# Patient Record
Sex: Male | Born: 1935 | Race: White | Hispanic: No | Marital: Married | Smoking: Former smoker
Health system: Southern US, Community
[De-identification: ages and names within clinical notes are randomized; demographics above are authoritative.]

## PROBLEM LIST (undated history)

## (undated) DIAGNOSIS — N400 Enlarged prostate without lower urinary tract symptoms: Secondary | ICD-10-CM

## (undated) DIAGNOSIS — E785 Hyperlipidemia, unspecified: Secondary | ICD-10-CM

## (undated) DIAGNOSIS — K219 Gastro-esophageal reflux disease without esophagitis: Secondary | ICD-10-CM

## (undated) DIAGNOSIS — M810 Age-related osteoporosis without current pathological fracture: Secondary | ICD-10-CM

## (undated) HISTORY — DX: Benign prostatic hyperplasia without lower urinary tract symptoms: N40.0

## (undated) HISTORY — DX: Hyperlipidemia, unspecified: E78.5

## (undated) HISTORY — DX: Age-related osteoporosis without current pathological fracture: M81.0

## (undated) HISTORY — PX: CATARACT EXTRACTION, BILATERAL: SHX1313

---

## 1998-02-24 ENCOUNTER — Emergency Department (HOSPITAL_COMMUNITY): Admission: EM | Admit: 1998-02-24 | Discharge: 1998-02-24 | Payer: Self-pay | Admitting: Emergency Medicine

## 2001-01-11 ENCOUNTER — Ambulatory Visit (HOSPITAL_COMMUNITY): Admission: RE | Admit: 2001-01-11 | Discharge: 2001-01-11 | Payer: Self-pay | Admitting: Gastroenterology

## 2003-03-06 ENCOUNTER — Encounter: Payer: Self-pay | Admitting: Family Medicine

## 2003-03-06 ENCOUNTER — Encounter: Admission: RE | Admit: 2003-03-06 | Discharge: 2003-03-06 | Payer: Self-pay | Admitting: Family Medicine

## 2005-02-19 ENCOUNTER — Encounter: Admission: RE | Admit: 2005-02-19 | Discharge: 2005-02-19 | Payer: Self-pay | Admitting: Family Medicine

## 2007-10-25 ENCOUNTER — Encounter: Admission: RE | Admit: 2007-10-25 | Discharge: 2007-10-25 | Payer: Self-pay | Admitting: Family Medicine

## 2010-06-27 ENCOUNTER — Encounter: Admission: RE | Admit: 2010-06-27 | Discharge: 2010-06-27 | Payer: Self-pay | Admitting: Family Medicine

## 2011-03-27 NOTE — Procedures (Signed)
[facility]  Patient:    Steven [name], Steven [name]                         MRN: [id number] Proc. Date: 01/11/01 Adm. Date:  [id number] Attending:  Louie Bun CC:         Desma Maxim, M.D.   Procedure Report  PROCEDURE:  Colonoscopy.  INDICATION FOR PROCEDURE:  Screening colonoscopy in a 77 year old patient.  DESCRIPTION OF PROCEDURE:  The patient was placed in the left lateral decubitus position and placed on a pulse monitor with continuous low-flow oxygen delivered by nasal cannula.  He was sedated with 90 mg IV Demerol and 9 mg IV Versed.  The Olympus video colonoscope was inserted into the rectum and advanced to the cecum, confirmed by transillumination at McBurneys point and visualization of the ileocecal valve and appendiceal orifice.  The prep was good.  The cecum, ascending, transverse, descending, and sigmoid colon all appeared normal with no masses, polyps, diverticula, or other mucosal abnormalities.  The rectum likewise appeared normal, and retroflex view of the anus revealed small internal hemorrhoids.  The colonoscope was then withdrawn, and the patient returned to the recovery room in stable condition.  He tolerated the procedure well, and there were no immediate complications.  IMPRESSION:  Small internal hemorrhoids, otherwise normal screening colonoscopy. DD:  01/11/01 TD:  01/12/01 Job: [id number]

## 2012-06-03 DIAGNOSIS — IMO0002 Reserved for concepts with insufficient information to code with codable children: Secondary | ICD-10-CM | POA: Insufficient documentation

## 2014-12-26 ENCOUNTER — Ambulatory Visit (INDEPENDENT_AMBULATORY_CARE_PROVIDER_SITE_OTHER): Payer: Medicare Other | Admitting: Podiatry

## 2014-12-26 VITALS — BP 140/71 | HR 76 | Resp 16

## 2014-12-26 DIAGNOSIS — M779 Enthesopathy, unspecified: Secondary | ICD-10-CM

## 2014-12-26 DIAGNOSIS — M79673 Pain in unspecified foot: Secondary | ICD-10-CM

## 2014-12-26 DIAGNOSIS — B351 Tinea unguium: Secondary | ICD-10-CM

## 2014-12-26 DIAGNOSIS — L84 Corns and callosities: Secondary | ICD-10-CM

## 2014-12-26 MED ORDER — TRIAMCINOLONE ACETONIDE 10 MG/ML IJ SUSP
10.0000 mg | Freq: Once | INTRAMUSCULAR | Status: AC
  Administered 2014-12-26: 10 mg

## 2014-12-26 NOTE — Progress Notes (Signed)
   Subjective:    Patient ID: Steven [name], male    DOB: [birth year], 81 y.o.   MRN: [id number]  HPI Pt presents with painful left foot callus and painful long nails   Review of Systems  All other systems reviewed and are negative.      Objective:   Physical Exam        Assessment & Plan:

## 2014-12-27 NOTE — Progress Notes (Signed)
Subjective:     Patient ID: Steven [name], male   DOB: Apr 07, 1931, 81 y.o.   MRN: [id number]  HPI patient presents with quite a bit of discomfort around the head of the fifth metatarsal left with fluid buildup and lesion formation and nail disease 1-5 both feet that are thick yellow brittle and he cannot cut himself and states they become tender in shoe gear   Review of Systems     Objective:   Physical Exam Neurovascular status intact with muscle strength adequate and noted to have fluid buildup fifth MPJ left and keratotic tissue formation that's painful along with nail disease 1-5 both feet that are yellow brittle and painful when pressed    Assessment:     Mycotic nail infection with inflammatory capsulitis fifth MPJ left and lesion formation    Plan:     H&P and condition discussed and did a careful plantar capsule injection 3 mg dexamethasone Kenalog 5 mill grams Xylocaine and did deep debridement of lesions and debrided nailbeds 1-5 both feet with no iatrogenic bleeding noted

## 2015-02-14 ENCOUNTER — Ambulatory Visit (INDEPENDENT_AMBULATORY_CARE_PROVIDER_SITE_OTHER): Payer: Medicare Other | Admitting: Podiatry

## 2015-02-14 ENCOUNTER — Ambulatory Visit (INDEPENDENT_AMBULATORY_CARE_PROVIDER_SITE_OTHER): Payer: Medicare Other

## 2015-02-14 VITALS — BP 125/71 | HR 80 | Resp 15

## 2015-02-14 DIAGNOSIS — R0989 Other specified symptoms and signs involving the circulatory and respiratory systems: Secondary | ICD-10-CM

## 2015-02-14 DIAGNOSIS — I7025 Atherosclerosis of native arteries of other extremities with ulceration: Secondary | ICD-10-CM

## 2015-02-14 DIAGNOSIS — M79672 Pain in left foot: Secondary | ICD-10-CM | POA: Diagnosis not present

## 2015-02-14 DIAGNOSIS — Q667 Congenital pes cavus: Secondary | ICD-10-CM | POA: Diagnosis not present

## 2015-02-14 DIAGNOSIS — M216X9 Other acquired deformities of unspecified foot: Secondary | ICD-10-CM

## 2015-02-14 DIAGNOSIS — L84 Corns and callosities: Secondary | ICD-10-CM | POA: Diagnosis not present

## 2015-02-17 NOTE — Progress Notes (Signed)
Subjective:     Patient ID: Steven [name], male   DOB: Mar 02, 1931, 81 y.o.   MRN: [id number]  HPI patient presents with quite a bit of pain around his fifth metatarsal head left that did well with injection trimming but now has reoccurred and states that it's hard for him to walk on.   Review of Systems     Objective:   Physical Exam Neurovascular status remains diminished but intact with good digital perfusion noted but severe discomfort underneath the fifth metatarsal head left with no fat pad noted and minimal keratotic tissue formation    Assessment:     Plantar flexed fifth metatarsal with no fat pad that's present with small lesion formation that is only a part of the pathological process    Plan:     I reviewed the fifth metatarsal head issue and in a perfect world I would like to consider removal and I'm for sending for vascular study to confirm adequate healing circulatory status. Today I debrided the lesion and padded and we will send him for vascular studies and be seen back again in 2 weeks to review the studies and what we can do to help him long-term. He is wearing padding and an issue and we may try to increase that for him

## 2015-02-19 ENCOUNTER — Telehealth: Payer: Self-pay | Admitting: Podiatry

## 2015-02-19 NOTE — Telephone Encounter (Signed)
Patient called stating he wants to cancel both the MRI Dr. Paulla Dolly scheduled for him as well as his follow up appointment scheduled with Dr. Paulla Dolly on 04/25. States he does not want to have surgery right now. Will call back if he decides to reschedule his appointment with [place]. Patient is calling to cancel the MRI himself.

## 2015-02-21 NOTE — Telephone Encounter (Signed)
Cancellation of MRI per Janett Billow

## 2015-02-22 ENCOUNTER — Encounter (HOSPITAL_COMMUNITY): Payer: Self-pay

## 2015-03-04 ENCOUNTER — Telehealth (HOSPITAL_COMMUNITY): Payer: Self-pay | Admitting: *Deleted

## 2015-03-04 ENCOUNTER — Ambulatory Visit: Payer: Medicare Other | Admitting: Podiatry

## 2015-04-01 ENCOUNTER — Ambulatory Visit: Payer: Medicare Other

## 2015-04-03 ENCOUNTER — Ambulatory Visit (INDEPENDENT_AMBULATORY_CARE_PROVIDER_SITE_OTHER): Payer: Medicare Other | Admitting: Podiatrist

## 2015-04-03 ENCOUNTER — Encounter: Payer: Self-pay | Admitting: Podiatrist

## 2015-04-03 DIAGNOSIS — M216X9 Other acquired deformities of unspecified foot: Secondary | ICD-10-CM

## 2015-04-03 DIAGNOSIS — M79673 Pain in unspecified foot: Secondary | ICD-10-CM

## 2015-04-03 DIAGNOSIS — Q667 Congenital pes cavus: Secondary | ICD-10-CM

## 2015-04-03 DIAGNOSIS — L84 Corns and callosities: Secondary | ICD-10-CM

## 2015-04-03 DIAGNOSIS — B351 Tinea unguium: Secondary | ICD-10-CM | POA: Diagnosis not present

## 2015-04-03 DIAGNOSIS — R0989 Other specified symptoms and signs involving the circulatory and respiratory systems: Secondary | ICD-10-CM

## 2015-04-08 NOTE — Progress Notes (Signed)
HPI:  Patient presents today for follow up of foot and nail care.relates pain submet 5 of the left foot that is relieved by thorough debridement.    Objective:  Patients chart is reviewed.  Vascular status reveals pedal pulses noted at 1 out of 4 dp and pt bilateral .  Neurological sensation is intact to [employer] monofilament bilateral.  Patients nails are thickened, discolored, distrophic, friable and brittle with yellow-brown discoloration. Patient subjectively relates they are painful with shoes and with ambulation of bilateral feet. Fat pad atrophy is present bilateral.  Hyperkeratotic lesion is present submet 5 left that is painful.  Intact integument is present post debridement. Prominent metatarsal head is present.   Assessment:  Symptomatic onychomycosis, prominent metatarsal head with callus submet 5 left.   Plan:  Discussed treatment options and alternatives.  The symptomatic toenails and hyperkeratotic lesion were debrided through manual an mechanical means without complication.  Return appointment recommended at routine intervals of 3 months    Trudie Buckler, DPM

## 2015-05-30 ENCOUNTER — Ambulatory Visit (INDEPENDENT_AMBULATORY_CARE_PROVIDER_SITE_OTHER): Payer: Medicare Other | Admitting: Podiatry

## 2015-05-30 ENCOUNTER — Encounter: Payer: Self-pay | Admitting: Podiatry

## 2015-05-30 DIAGNOSIS — M79674 Pain in right toe(s): Secondary | ICD-10-CM

## 2015-05-30 DIAGNOSIS — M79676 Pain in unspecified toe(s): Secondary | ICD-10-CM | POA: Diagnosis not present

## 2015-05-30 DIAGNOSIS — B351 Tinea unguium: Secondary | ICD-10-CM

## 2015-05-30 DIAGNOSIS — Q828 Other specified congenital malformations of skin: Secondary | ICD-10-CM

## 2015-05-30 DIAGNOSIS — M79675 Pain in left toe(s): Secondary | ICD-10-CM

## 2015-05-30 NOTE — Progress Notes (Signed)
Patient ID: Steven [name], male   DOB: October 22, 1931, 80 y.o.   MRN: [id number] Complaint:  Visit Type: Patient returns to my office for continued preventative foot care services. Complaint: Patient states" my nails have grown long and thick and become painful to walk and wear shoes" . The patient presents for preventative foot care services. No changes to ROS.  Painful callus under left foot.  Podiatric Exam: Vascular: dorsalis pedis and posterior tibial pulses are palpable bilateral. Capillary return is immediate. Temperature gradient is WNL. Skin turgor WNL  Sensorium: Normal Sem[employer] Weinstein monofilament test. Normal tactile sensation bilaterally. Nail Exam: Pt has thick disfigured discolored nails with subungual debris noted bilateral entire nail hallux through fifth toenails Ulcer Exam: There is no evidence of ulcer or pre-ulcerative changes or infection. Orthopedic Exam: Muscle tone and strength are WNL. No limitations in general ROM. No crepitus or effusions noted. Foot type and digits show no abnormalities. Bony prominences are unremarkable. Skin:  Porokeratosis sub 5th left foot.. No infection or ulcers  Diagnosis:  Onychomycosis, , Pain in right toe, pain in left toes, porokeratosis  Treatment & Plan Procedures and Treatment: Consent by patient was obtained for treatment procedures. The patient understood the discussion of treatment and procedures well. All questions were answered thoroughly reviewed. Debridement of mycotic and hypertrophic toenails, 1 through 5 bilateral and clearing of subungual debris. No ulceration, no infection noted. Debride porokeratosis left foot. Return Visit-Office Procedure: Patient instructed to return to the office for a follow up visit 3 months for continued evaluation and treatment.

## 2015-07-24 DIAGNOSIS — H02103 Unspecified ectropion of right eye, unspecified eyelid: Secondary | ICD-10-CM | POA: Insufficient documentation

## 2015-07-24 DIAGNOSIS — H02106 Unspecified ectropion of left eye, unspecified eyelid: Secondary | ICD-10-CM

## 2015-07-30 ENCOUNTER — Ambulatory Visit (INDEPENDENT_AMBULATORY_CARE_PROVIDER_SITE_OTHER): Payer: Medicare Other | Admitting: Podiatry

## 2015-07-30 DIAGNOSIS — M79674 Pain in right toe(s): Secondary | ICD-10-CM

## 2015-07-30 DIAGNOSIS — M79676 Pain in unspecified toe(s): Secondary | ICD-10-CM

## 2015-07-30 DIAGNOSIS — M79675 Pain in left toe(s): Secondary | ICD-10-CM | POA: Diagnosis not present

## 2015-07-30 DIAGNOSIS — B351 Tinea unguium: Secondary | ICD-10-CM

## 2015-07-30 NOTE — Progress Notes (Signed)
Patient ID: Steven [name], male   DOB: 02/19/1931, 81 y.o.   MRN: [id number] Complaint:  Visit Type: Patient returns to my office for continued preventative foot care services. Complaint: Patient states" my nails have grown long and thick and become painful to walk and wear shoes" . The patient presents for preventative foot care services. No changes to ROS.  Painful callus under left foot.  Podiatric Exam: Vascular: dorsalis pedis and posterior tibial pulses are palpable bilateral. Capillary return is immediate. Temperature gradient is WNL. Skin turgor WNL  Sensorium: Normal Semmes Weinstein monofilament test. Normal tactile sensation bilaterally. Nail Exam: Pt has thick disfigured discolored nails with subungual debris noted bilateral entire nail hallux through fifth toenails Ulcer Exam: There is no evidence of ulcer or pre-ulcerative changes or infection. Orthopedic Exam: Muscle tone and strength are WNL. No limitations in general ROM. No crepitus or effusions noted. Foot type and digits show no abnormalities. Bony prominences are unremarkable. Skin:  Porokeratosis sub 5th left foot.. No infection or ulcers  Diagnosis:  Onychomycosis, , Pain in right toe, pain in left toes, porokeratosis  Treatment & Plan Procedures and Treatment: Consent by patient was obtained for treatment procedures. The patient understood the discussion of treatment and procedures well. All questions were answered thoroughly reviewed. Debridement of mycotic and hypertrophic toenails, 1 through 5 bilateral and clearing of subungual debris. No ulceration, no infection noted. Debride porokeratosis left foot. Return Visit-Office Procedure: Patient instructed to return to the office for a follow up visit 3 months for continued evaluation and treatment. 

## 2015-10-10 ENCOUNTER — Ambulatory Visit (INDEPENDENT_AMBULATORY_CARE_PROVIDER_SITE_OTHER): Payer: Medicare Other | Admitting: Podiatry

## 2015-10-10 ENCOUNTER — Encounter: Payer: Self-pay | Admitting: Podiatry

## 2015-10-10 DIAGNOSIS — M79676 Pain in unspecified toe(s): Secondary | ICD-10-CM

## 2015-10-10 DIAGNOSIS — M79675 Pain in left toe(s): Secondary | ICD-10-CM | POA: Diagnosis not present

## 2015-10-10 DIAGNOSIS — Q828 Other specified congenital malformations of skin: Secondary | ICD-10-CM | POA: Diagnosis not present

## 2015-10-10 DIAGNOSIS — B351 Tinea unguium: Secondary | ICD-10-CM

## 2015-10-10 DIAGNOSIS — M79674 Pain in right toe(s): Secondary | ICD-10-CM

## 2015-10-10 NOTE — Progress Notes (Signed)
Patient ID: Steven [name], male   DOB: 03/16/1931, 81 y.o.   MRN: [id number] Complaint:  Visit Type: Patient returns to my office for continued preventative foot care services. Complaint: Patient states" my nails have grown long and thick and become painful to walk and wear shoes" . The patient presents for preventative foot care services. No changes to ROS.  Painful callus under left foot.  Podiatric Exam: Vascular: dorsalis pedis and posterior tibial pulses are palpable bilateral. Capillary return is immediate. Temperature gradient is WNL. Skin turgor WNL  Sensorium: Normal Semmes Weinstein monofilament test. Normal tactile sensation bilaterally. Nail Exam: Pt has thick disfigured discolored nails with subungual debris noted bilateral entire nail hallux through fifth toenails Ulcer Exam: There is no evidence of ulcer or pre-ulcerative changes or infection. Orthopedic Exam: Muscle tone and strength are WNL. No limitations in general ROM. No crepitus or effusions noted. Foot type and digits show no abnormalities. Bony prominences are unremarkable. Skin:  Porokeratosis sub 5th left foot.. No infection or ulcers  Diagnosis:  Onychomycosis, , Pain in right toe, pain in left toes, porokeratosis  Treatment & Plan Procedures and Treatment: Consent by patient was obtained for treatment procedures. The patient understood the discussion of treatment and procedures well. All questions were answered thoroughly reviewed. Debridement of mycotic and hypertrophic toenails, 1 through 5 bilateral and clearing of subungual debris. No ulceration, no infection noted. Debride porokeratosis left foot. Return Visit-Office Procedure: Patient instructed to return to the office for a follow up visit 3 months for continued evaluation and treatment. 

## 2015-11-05 ENCOUNTER — Ambulatory Visit: Payer: Medicare Other | Admitting: Podiatry

## 2015-11-10 HISTORY — PX: OTHER SURGICAL HISTORY: SHX169

## 2015-12-19 ENCOUNTER — Encounter: Payer: Self-pay | Admitting: Podiatry

## 2015-12-19 ENCOUNTER — Ambulatory Visit (INDEPENDENT_AMBULATORY_CARE_PROVIDER_SITE_OTHER): Payer: Medicare Other | Admitting: Podiatry

## 2015-12-19 DIAGNOSIS — M79676 Pain in unspecified toe(s): Secondary | ICD-10-CM

## 2015-12-19 DIAGNOSIS — M79675 Pain in left toe(s): Secondary | ICD-10-CM

## 2015-12-19 DIAGNOSIS — B351 Tinea unguium: Secondary | ICD-10-CM | POA: Diagnosis not present

## 2015-12-19 DIAGNOSIS — Q828 Other specified congenital malformations of skin: Secondary | ICD-10-CM | POA: Diagnosis not present

## 2015-12-19 NOTE — Progress Notes (Signed)
Patient ID: DERYK PFOST, male   DOB: Dec 04, 1930, 82 y.o.   MRN: [id number] Complaint:  Visit Type: Patient returns to my office for continued preventative foot care services. Complaint: Patient states" my nails have grown long and thick and become painful to walk and wear shoes" . The patient presents for preventative foot care services. No changes to ROS.  Painful callus under left foot.  Podiatric Exam: Vascular: dorsalis pedis and posterior tibial pulses are palpable bilateral. Capillary return is immediate. Temperature gradient is WNL. Skin turgor WNL  Sensorium: Normal Semmes Weinstein monofilament test. Normal tactile sensation bilaterally. Nail Exam: Pt has thick disfigured discolored nails with subungual debris noted bilateral entire nail hallux through fifth toenails Ulcer Exam: There is no evidence of ulcer or pre-ulcerative changes or infection. Orthopedic Exam: Muscle tone and strength are WNL. No limitations in general ROM. No crepitus or effusions noted. Foot type and digits show no abnormalities. Bony prominences are unremarkable. Skin:  Porokeratosis sub 5th left foot.. No infection or ulcers  Diagnosis:  Onychomycosis, , Pain in right toe, pain in left toes, porokeratosis  Treatment & Plan Procedures and Treatment: Consent by patient was obtained for treatment procedures. The patient understood the discussion of treatment and procedures well. All questions were answered thoroughly reviewed. Debridement of mycotic and hypertrophic toenails, 1 through 5 bilateral and clearing of subungual debris. No ulceration, no infection noted. Debride porokeratosis left foot. Return Visit-Office Procedure: Patient instructed to return to the office for a follow up visit 3 months for continued evaluation and treatment.   Gardiner Barefoot DPM

## 2016-03-19 ENCOUNTER — Encounter: Payer: Self-pay | Admitting: Podiatry

## 2016-03-19 ENCOUNTER — Ambulatory Visit (INDEPENDENT_AMBULATORY_CARE_PROVIDER_SITE_OTHER): Payer: Medicare Other | Admitting: Podiatry

## 2016-03-19 DIAGNOSIS — M779 Enthesopathy, unspecified: Secondary | ICD-10-CM

## 2016-03-19 DIAGNOSIS — M79675 Pain in left toe(s): Secondary | ICD-10-CM

## 2016-03-19 DIAGNOSIS — B351 Tinea unguium: Secondary | ICD-10-CM

## 2016-03-19 DIAGNOSIS — M79676 Pain in unspecified toe(s): Secondary | ICD-10-CM

## 2016-03-19 DIAGNOSIS — Q828 Other specified congenital malformations of skin: Secondary | ICD-10-CM

## 2016-03-19 NOTE — Progress Notes (Signed)
Patient ID: JAKYLE WOLIVER, male   DOB: 02/25/1931, 82 y.o.   MRN: [id number] Complaint:  Visit Type: Patient returns to my office for continued preventative foot care services. Complaint: Patient states" my nails have grown long and thick and become painful to walk and wear shoes" . The patient presents for preventative foot care services. No changes to ROS.  Painful callus under left foot.  Podiatric Exam: Vascular: dorsalis pedis and posterior tibial pulses are palpable bilateral. Capillary return is immediate. Temperature gradient is WNL. Skin turgor WNL  Sensorium: Normal Semmes Weinstein monofilament test. Normal tactile sensation bilaterally. Nail Exam: Pt has thick disfigured discolored nails with subungual debris noted bilateral entire nail hallux through fifth toenails Ulcer Exam: There is no evidence of ulcer or pre-ulcerative changes or infection. Orthopedic Exam: Muscle tone and strength are WNL. No limitations in general ROM. No crepitus or effusions noted. Foot type and digits show no abnormalities. Bony prominences are unremarkable. Skin:  Porokeratosis sub 5th left foot.. No infection or ulcers  Diagnosis:  Onychomycosis, , Pain in right toe, pain in left toes, porokeratosis  Treatment & Plan Procedures and Treatment: Consent by patient was obtained for treatment procedures. The patient understood the discussion of treatment and procedures well. All questions were answered thoroughly reviewed. Debridement of mycotic and hypertrophic toenails, 1 through 5 bilateral and clearing of subungual debris. No ulceration, no infection noted. Debride porokeratosis left foot after injection left foot. Return Visit-Office Procedure: Patient instructed to return to the office for a follow up visit 3 months for continued evaluation and treatment.   Gardiner Barefoot DPM

## 2016-05-28 ENCOUNTER — Ambulatory Visit (INDEPENDENT_AMBULATORY_CARE_PROVIDER_SITE_OTHER): Payer: Medicare Other | Admitting: Podiatry

## 2016-05-28 ENCOUNTER — Encounter: Payer: Self-pay | Admitting: Podiatry

## 2016-05-28 DIAGNOSIS — Q828 Other specified congenital malformations of skin: Secondary | ICD-10-CM

## 2016-05-28 DIAGNOSIS — M79675 Pain in left toe(s): Secondary | ICD-10-CM | POA: Diagnosis not present

## 2016-05-28 DIAGNOSIS — B351 Tinea unguium: Secondary | ICD-10-CM

## 2016-05-28 DIAGNOSIS — M79676 Pain in unspecified toe(s): Secondary | ICD-10-CM | POA: Diagnosis not present

## 2016-05-28 NOTE — Progress Notes (Signed)
Patient ID: Steven [name], male   DOB: 11-Apr-1931, 82 y.o.   MRN: [id number] Complaint:  Visit Type: Patient returns to my office for continued preventative foot care services. Complaint: Patient states" my nails have grown long and thick and become painful to walk and wear shoes" . The patient presents for preventative foot care services. No changes to ROS.  Painful callus under left foot.  Podiatric Exam: Vascular: dorsalis pedis and posterior tibial pulses are palpable bilateral. Capillary return is immediate. Temperature gradient is WNL. Skin turgor WNL  Sensorium: Normal Sem[employer] Weinstein monofilament test. Normal tactile sensation bilaterally. Nail Exam: Pt has thick disfigured discolored nails with subungual debris noted bilateral entire nail hallux through fifth toenails Ulcer Exam: There is no evidence of ulcer or pre-ulcerative changes or infection. Orthopedic Exam: Muscle tone and strength are WNL. No limitations in general ROM. No crepitus or effusions noted. Foot type and digits show no abnormalities. Bony prominences are unremarkable. Skin:  Porokeratosis sub 5th left foot.. No infection or ulcers  Diagnosis:  Onychomycosis, , Pain in right toe, pain in left toes, porokeratosis  Treatment & Plan Procedures and Treatment: Consent by patient was obtained for treatment procedures. The patient understood the discussion of treatment and procedures well. All questions were answered thoroughly reviewed. Debridement of mycotic and hypertrophic toenails, 1 through 5 bilateral and clearing of subungual debris. No ulceration, no infection noted. Debride porokeratosis left foot after injection left foot. Return Visit-Office Procedure: Patient instructed to return to the office for a follow up visit 3 months for continued evaluation and treatment.   Gardiner Barefoot DPM

## 2016-06-14 ENCOUNTER — Emergency Department (HOSPITAL_COMMUNITY)
Admission: EM | Admit: 2016-06-14 | Discharge: 2016-06-14 | Disposition: A | Discharge status: Home / Self Care | Payer: Medicare Other | Attending: Physician Assistant | Admitting: Physician Assistant

## 2016-06-14 ENCOUNTER — Encounter (HOSPITAL_COMMUNITY): Payer: Self-pay | Admitting: *Deleted

## 2016-06-14 DIAGNOSIS — M5432 Sciatica, left side: Secondary | ICD-10-CM

## 2016-06-14 DIAGNOSIS — Z87891 Personal history of nicotine dependence: Secondary | ICD-10-CM | POA: Insufficient documentation

## 2016-06-14 DIAGNOSIS — M5442 Lumbago with sciatica, left side: Secondary | ICD-10-CM | POA: Diagnosis not present

## 2016-06-14 DIAGNOSIS — M545 Low back pain: Secondary | ICD-10-CM | POA: Diagnosis present

## 2016-06-14 HISTORY — DX: Gastro-esophageal reflux disease without esophagitis: K21.9

## 2016-06-14 MED ORDER — OXYCODONE-ACETAMINOPHEN 5-325 MG PO TABS: 1.0000 | ORAL_TABLET | Freq: Four times a day (QID) | ORAL | 0 refills | Status: DC | PRN

## 2016-06-14 MED ORDER — OXYCODONE-ACETAMINOPHEN 5-325 MG PO TABS
1.0000 | ORAL_TABLET | Freq: Once | ORAL | Status: AC
Start: 2016-06-14 — End: 2016-06-14
  Administered 2016-06-14: 1 via ORAL
  Filled 2016-06-14: qty 1

## 2016-06-14 NOTE — ED Notes (Signed)
Bed: WA22 Expected date:  Expected time:  Means of arrival:  Comments: 

## 2016-06-14 NOTE — ED Provider Notes (Signed)
[facility] DEPT Provider Note   CSN: [id number] Arrival date & time: 06/14/16  0900  First Provider Contact:  None       History   Chief Complaint Chief Complaint  Patient presents with  . Back Pain    left flank radiating down leg     HPI Steven [name] is a 82 y.o. male.  The history is provided by the patient.  Back Pain   This is a recurrent problem. The current episode started more than 2 days ago. The problem occurs constantly. The problem has not changed since onset.The pain is associated with no known injury. The pain is present in the lumbar spine. The quality of the pain is described as shooting. The pain radiates to the left thigh. The pain is at a severity of 10/10. The pain is moderate. The symptoms are aggravated by bending and twisting. The pain is worse during the day. Pertinent negatives include no chest pain, no fever, no numbness, no abdominal pain and no weakness. He has tried analgesics for the symptoms. The treatment provided mild relief.    Pt went to doctor earlier this week, received pain medication, has been taking it.  Still has pain.  REferral to Orthopedics. on Wednesday. His happened after picking up a 28 pound bag of cat litter.  Past Medical History:  Diagnosis Date  . Acid reflux     There are no active problems to display for this patient.   Past Surgical History:  Procedure Laterality Date  . CATARACT EXTRACTION, BILATERAL         Home Medications    Prior to Admission medications   Medication Sig Start Date End Date Taking? Authorizing Provider  loratadine (CLARITIN) 10 MG tablet Take 10 mg by mouth daily.    Historical Provider, MD  MULTIPLE VITAMIN PO Take by mouth.    Historical Provider, MD  Omega-3 Fatty Acids (FISH OIL) 1000 MG CAPS Take by mouth.    Historical Provider, MD    Family History History reviewed. No pertinent family history.  Social History Social History  Substance Use Topics  . Smoking status: Former  Smoker    Quit date: 1973  . Smokeless tobacco: Never Used  . Alcohol use No     Allergies   Penicillins   Review of Systems Review of Systems  Constitutional: Negative for activity change and fever.  Respiratory: Negative for shortness of breath.   Cardiovascular: Negative for chest pain.  Gastrointestinal: Negative for abdominal pain.  Genitourinary: Negative for difficulty urinating.  Musculoskeletal: Positive for back pain.  Neurological: Negative for weakness and numbness.  All other systems reviewed and are negative.    Physical Exam Updated Vital Signs BP 138/83 (BP Location: Left Arm)   Pulse 102   Temp 98.7 F (37.1 C) (Oral)   Resp 18   Ht 5\' 8"  (1.727 m)   Wt 168 lb (76.2 kg)   SpO2 95%   BMI 25.54 kg/m   Physical Exam  Constitutional: He appears well-developed and well-nourished.  HENT:  Head: Normocephalic and atraumatic.  Eyes: Conjunctivae are normal.  Neck: Neck supple.  Cardiovascular: Normal rate and regular rhythm.   No murmur heard. Pulmonary/Chest: Effort normal and breath sounds normal. No respiratory distress.  Abdominal: Soft. There is no tenderness.  Musculoskeletal: He exhibits no edema.  Normal rom in legs and hips. Normal sensation.   Neurological: He is alert.  Skin: Skin is warm and dry.  Psychiatric: He has a normal  mood and affect.  Nursing note and vitals reviewed.    ED Treatments / Results  Labs (all labs ordered are listed, but only abnormal results are displayed) Labs Reviewed - No data to display  EKG  EKG Interpretation None       Radiology No results found.  Procedures Procedures (including critical care time)  Medications Ordered in ED Medications - No data to display   Initial Impression / Assessment and Plan / ED Course  I have reviewed the triage vital signs and the nursing notes.  Pertinent labs & imaging results that were available during my care of the patient were reviewed by me and  considered in my medical decision making (see chart for details).  Clinical Course    Patient is a 48-year-old male presenting with back pain. Back pain radiates to his left thigh. Patient to C position has pain medication and referral for Wednesday. Patient has no right fact that symptoms such as fever, lack of continence, weakness. Patient has reassuring physical exam and vital signs. Likiely sciatica. Will give pain medication.   Warned patient about Percocet causing increasing dizziness. However patient has tried heat and cold and Tylenol at home to no effect.  Final Clinical Impressions(s) / ED Diagnoses   Final diagnoses:  None    New Prescriptions New Prescriptions   No medications on file     Leanard Dimaio Julio Alm, MD 06/14/16 1005

## 2016-06-14 NOTE — ED Triage Notes (Signed)
Patient arrived via EMS states back pain started last Saturday. He saw a chiropractor on Monday and had has had 2 treatments but pain has gotten worse.  Per patient he  he is unable to walk due to the pain.

## 2016-06-14 NOTE — Discharge Instructions (Signed)
You can use this Percocet which is a strong medication to help with your pain. Be very careful to not drink with it. Also it may make you unsteady so be careful. Please follow-up with your orthopedist on Wednesday as planned.

## 2016-08-06 ENCOUNTER — Ambulatory Visit: Payer: Medicare Other | Admitting: Podiatry

## 2016-09-09 DIAGNOSIS — M810 Age-related osteoporosis without current pathological fracture: Secondary | ICD-10-CM

## 2016-09-09 HISTORY — DX: Age-related osteoporosis without current pathological fracture: M81.0

## 2017-02-11 ENCOUNTER — Ambulatory Visit (INDEPENDENT_AMBULATORY_CARE_PROVIDER_SITE_OTHER): Payer: Medicare Other | Admitting: Podiatry

## 2017-02-11 ENCOUNTER — Encounter: Payer: Self-pay | Admitting: Podiatry

## 2017-02-11 DIAGNOSIS — B351 Tinea unguium: Secondary | ICD-10-CM

## 2017-02-11 DIAGNOSIS — M79675 Pain in left toe(s): Secondary | ICD-10-CM | POA: Diagnosis not present

## 2017-02-11 NOTE — Progress Notes (Signed)
Patient ID: ELMO RIO, male   DOB: February 16, 1931, 83 y.o.   MRN: [id number] Complaint:  Visit Type: Patient returns to my office for continued preventative foot care services. Complaint: Patient states" my nails have grown long and thick and become painful to walk and wear shoes" . The patient presents for preventative foot care services. No changes to ROS.  Patient has back issues and presents with no evidence of painful callus left foot.  Podiatric Exam: Vascular: dorsalis pedis and posterior tibial pulses are palpable bilateral. Capillary return is immediate. Temperature gradient is WNL. Skin turgor WNL  Sensorium: Normal Sem[employer] Weinstein monofilament test. Normal tactile sensation bilaterally. Nail Exam: Pt has thick disfigured discolored nails with subungual debris noted bilateral entire nail hallux through fifth toenails Ulcer Exam: There is no evidence of ulcer or pre-ulcerative changes or infection. Orthopedic Exam: Muscle tone and strength are WNL. No limitations in general ROM. No crepitus or effusions noted. Foot type and digits show no abnormalities. Bony prominences are unremarkable. Skin:  . No infection or ulcers  Diagnosis:  Onychomycosis, , Pain in right toe, pain in left toes,   Treatment & Plan Procedures and Treatment: Consent by patient was obtained for treatment procedures. The patient understood the discussion of treatment and procedures well. All questions were answered thoroughly reviewed. Debridement of mycotic and hypertrophic toenails, 1 through 5 bilateral and clearing of subungual debris. No ulceration, no infection noted.  Return Visit-Office Procedure: Patient instructed to return to the office for a follow up visit 3 months for continued evaluation and treatment.   Gardiner Barefoot DPM

## 2017-05-19 ENCOUNTER — Encounter: Payer: Self-pay | Admitting: Podiatry

## 2017-05-19 ENCOUNTER — Ambulatory Visit (INDEPENDENT_AMBULATORY_CARE_PROVIDER_SITE_OTHER): Payer: Medicare Other | Admitting: Podiatry

## 2017-05-19 DIAGNOSIS — M79675 Pain in left toe(s): Secondary | ICD-10-CM | POA: Diagnosis not present

## 2017-05-19 DIAGNOSIS — B351 Tinea unguium: Secondary | ICD-10-CM

## 2017-05-19 NOTE — Progress Notes (Signed)
Patient ID: Steven [name], male   DOB: 10/01/1931, 83 y.o.   MRN: [id number] Complaint:  Visit Type: Patient returns to my office for continued preventative foot care services. Complaint: Patient states" my nails have grown long and thick and become painful to walk and wear shoes" . The patient presents for preventative foot care services. No changes to ROS.  Patient has back issues and presents with no evidence of painful callus left foot.  Podiatric Exam: Vascular: dorsalis pedis and posterior tibial pulses are palpable bilateral. Capillary return is immediate. Temperature gradient is WNL. Skin turgor WNL  Sensorium: Normal Semmes Weinstein monofilament test. Normal tactile sensation bilaterally. Nail Exam: Pt has thick disfigured discolored nails with subungual debris noted bilateral entire nail hallux through fifth toenails Ulcer Exam: There is no evidence of ulcer or pre-ulcerative changes or infection. Orthopedic Exam: Muscle tone and strength are WNL. No limitations in general ROM. No crepitus or effusions noted. Foot type and digits show no abnormalities. Bony prominences are unremarkable. Skin:  . No infection or ulcers  Diagnosis:  Onychomycosis, , Pain in right toe, pain in left toes,   Treatment & Plan Procedures and Treatment: Consent by patient was obtained for treatment procedures. The patient understood the discussion of treatment and procedures well. All questions were answered thoroughly reviewed. Debridement of mycotic and hypertrophic toenails, 1 through 5 bilateral and clearing of subungual debris. No ulceration, no infection noted.  Return Visit-Office Procedure: Patient instructed to return to the office for a follow up visit 3 months for continued evaluation and treatment.   Gardiner Barefoot DPM

## 2017-08-25 ENCOUNTER — Ambulatory Visit (INDEPENDENT_AMBULATORY_CARE_PROVIDER_SITE_OTHER): Payer: Medicare Other | Admitting: Podiatry

## 2017-08-25 ENCOUNTER — Encounter: Payer: Self-pay | Admitting: Podiatry

## 2017-08-25 DIAGNOSIS — M79674 Pain in right toe(s): Secondary | ICD-10-CM | POA: Diagnosis not present

## 2017-08-25 DIAGNOSIS — B351 Tinea unguium: Secondary | ICD-10-CM | POA: Diagnosis not present

## 2017-08-25 DIAGNOSIS — M79675 Pain in left toe(s): Secondary | ICD-10-CM

## 2017-08-25 DIAGNOSIS — L309 Dermatitis, unspecified: Secondary | ICD-10-CM

## 2017-08-25 MED ORDER — CLOTRIMAZOLE-BETAMETHASONE 1-0.05 % EX CREA: 1.0000 "application " | TOPICAL_CREAM | Freq: Two times a day (BID) | CUTANEOUS | 0 refills | Status: DC

## 2017-08-25 NOTE — Progress Notes (Signed)
Patient ID: Steven [name], male   DOB: 1931/10/28, 83 y.o.   MRN: [id number] Complaint:  Visit Type: Patient returns to my office for continued preventative foot care services. Complaint: Patient states" my nails have grown long and thick and become painful to walk and wear shoes" . The patient presents for preventative foot care services. No changes to ROS.  Patient has back issues and presents with no evidence of painful callus left foot.  He does admit to having circular skin lesions on the top of his right foot.  No pain or itching noted.  Podiatric Exam: Vascular: dorsalis pedis and posterior tibial pulses are palpable bilateral. Capillary return is immediate. Temperature gradient is WNL. Skin turgor WNL  Sensorium: Normal Semmes Weinstein monofilament test. Normal tactile sensation bilaterally. Nail Exam: Pt has thick disfigured discolored nails with subungual debris noted bilateral entire nail hallux through fifth toenails Ulcer Exam: There is no evidence of ulcer or pre-ulcerative changes or infection. Orthopedic Exam: Muscle tone and strength are WNL. No limitations in general ROM. No crepitus or effusions noted. Foot type and digits show no abnormalities. Bony prominences are unremarkable. Skin:  . No infection or ulcers.  Multiple circular blistery skin lesions right foot.  Diagnosis:  Onychomycosis, , Pain in right toe, pain in left toes, Dermatitis.  Treatment & Plan Procedures and Treatment: Consent by patient was obtained for treatment procedures. The patient understood the discussion of treatment and procedures well. All questions were answered thoroughly reviewed. Debridement of mycotic and hypertrophic toenails, 1 through 5 bilateral and clearing of subungual debris. No ulceration, no infection noted. Prescribed lotrisone. Return Visit-Office Procedure: Patient instructed to return to the office for a follow up visit 3 months for continued evaluation and treatment.   Gardiner Barefoot DPM

## 2017-11-24 ENCOUNTER — Ambulatory Visit (INDEPENDENT_AMBULATORY_CARE_PROVIDER_SITE_OTHER): Payer: Medicare Other | Admitting: Podiatry

## 2017-11-24 ENCOUNTER — Encounter: Payer: Self-pay | Admitting: Podiatry

## 2017-11-24 DIAGNOSIS — B351 Tinea unguium: Secondary | ICD-10-CM | POA: Diagnosis not present

## 2017-11-24 DIAGNOSIS — M79674 Pain in right toe(s): Secondary | ICD-10-CM | POA: Diagnosis not present

## 2017-11-24 NOTE — Progress Notes (Signed)
Patient ID: Steven [name], male   DOB: [birth year], 84 y.o.   MRN: [id number] Complaint:  Visit Type: Patient returns to my office for continued preventative foot care services. Complaint: Patient states" my nails have grown long and thick and become painful to walk and wear shoes" . The patient presents for preventative foot care services. No changes to ROS.  No pain or itching noted.  Podiatric Exam: Vascular: dorsalis pedis and posterior tibial pulses are palpable bilateral. Capillary return is immediate. Temperature gradient is WNL. Skin turgor WNL  Sensorium: Normal Semmes Weinstein monofilament test. Normal tactile sensation bilaterally. Nail Exam: Pt has thick disfigured discolored nails with subungual debris noted bilateral entire nail hallux through fifth toenails Ulcer Exam: There is no evidence of ulcer or pre-ulcerative changes or infection. Orthopedic Exam: Muscle tone and strength are WNL. No limitations in general ROM. No crepitus or effusions noted. Foot type and digits show no abnormalities. Bony prominences are unremarkable. Skin:  . No infection or ulcers.    Diagnosis:  Onychomycosis, , Pain in right toe, pain in left toes,  S/P dermatitis  Treatment & Plan Procedures and Treatment: Consent by patient was obtained for treatment procedures. The patient understood the discussion of treatment and procedures well. All questions were answered thoroughly reviewed. Debridement of mycotic and hypertrophic toenails, 1 through 5 bilateral and clearing of subungual debris. No ulceration, no infection noted. . Return Visit-Office Procedure: Patient instructed to return to the office for a follow up visit 3 months for continued evaluation and treatment.   Gardiner Barefoot DPM

## 2018-02-23 ENCOUNTER — Encounter: Payer: Self-pay | Admitting: Podiatry

## 2018-02-23 ENCOUNTER — Ambulatory Visit (INDEPENDENT_AMBULATORY_CARE_PROVIDER_SITE_OTHER): Payer: Medicare Other | Admitting: Podiatry

## 2018-02-23 DIAGNOSIS — B351 Tinea unguium: Secondary | ICD-10-CM

## 2018-02-23 DIAGNOSIS — M79674 Pain in right toe(s): Secondary | ICD-10-CM

## 2018-02-23 NOTE — Progress Notes (Signed)
Patient ID: Steven [name], male   DOB: Aug 29, 1931, 84 y.o.   MRN: [id number] Complaint:  Visit Type: Patient returns to my office for continued preventative foot care services. Complaint: Patient states" my nails have grown long and thick and become painful to walk and wear shoes" . The patient presents for preventative foot care services. No changes to ROS.  No pain or itching noted.  Podiatric Exam: Vascular: dorsalis pedis and posterior tibial pulses are palpable bilateral. Capillary return is immediate. Temperature gradient is WNL. Skin turgor WNL  Sensorium: Normal Semmes Weinstein monofilament test. Normal tactile sensation bilaterally. Nail Exam: Pt has thick disfigured discolored nails with subungual debris noted bilateral entire nail hallux through fifth toenails Ulcer Exam: There is no evidence of ulcer or pre-ulcerative changes or infection. Orthopedic Exam: Muscle tone and strength are WNL. No limitations in general ROM. No crepitus or effusions noted. Foot type and digits show no abnormalities. Bony prominences are sub 5th  B/L. Skin:  . No infection or ulcers.    Diagnosis:  Onychomycosis, , Pain in right toe, pain in left toe  Treatment & Plan Procedures and Treatment: Consent by patient was obtained for treatment procedures. The patient understood the discussion of treatment and procedures well. All questions were answered thoroughly reviewed. Debridement of mycotic and hypertrophic toenails, 1 through 5 bilateral and clearing of subungual debris. No ulceration, no infection noted. . Return Visit-Office Procedure: Patient instructed to return to the office for a follow up visit 3 months for continued evaluation and treatment.   Gardiner Barefoot DPM

## 2018-03-23 ENCOUNTER — Ambulatory Visit
Admission: RE | Admit: 2018-03-23 | Discharge: 2018-03-23 | Disposition: A | Discharge status: Home / Self Care | Payer: Medicare Other | Source: Ambulatory Visit | Attending: Family Medicine | Admitting: Family Medicine

## 2018-03-23 ENCOUNTER — Other Ambulatory Visit: Payer: Self-pay | Admitting: Family Medicine

## 2018-03-23 DIAGNOSIS — R05 Cough: Secondary | ICD-10-CM

## 2018-03-23 DIAGNOSIS — R059 Cough, unspecified: Secondary | ICD-10-CM

## 2018-03-29 ENCOUNTER — Other Ambulatory Visit: Payer: Self-pay | Admitting: Family Medicine

## 2018-03-29 DIAGNOSIS — R059 Cough, unspecified: Secondary | ICD-10-CM

## 2018-03-29 DIAGNOSIS — R05 Cough: Secondary | ICD-10-CM

## 2018-04-08 ENCOUNTER — Ambulatory Visit
Admission: RE | Admit: 2018-04-08 | Discharge: 2018-04-08 | Disposition: A | Discharge status: Home / Self Care | Payer: Medicare Other | Source: Ambulatory Visit | Attending: Family Medicine | Admitting: Family Medicine

## 2018-04-08 DIAGNOSIS — R05 Cough: Secondary | ICD-10-CM

## 2018-04-08 DIAGNOSIS — R059 Cough, unspecified: Secondary | ICD-10-CM

## 2018-04-08 MED ORDER — IOPAMIDOL (ISOVUE-300) INJECTION 61%
75.0000 mL | Freq: Once | INTRAVENOUS | Status: AC | PRN
  Administered 2018-04-08: 75 mL via INTRAVENOUS

## 2018-05-20 ENCOUNTER — Other Ambulatory Visit: Payer: Self-pay | Admitting: *Deleted

## 2018-05-20 ENCOUNTER — Encounter: Payer: Self-pay | Admitting: *Deleted

## 2018-05-20 DIAGNOSIS — Z974 Presence of external hearing-aid: Secondary | ICD-10-CM | POA: Insufficient documentation

## 2018-05-20 DIAGNOSIS — K219 Gastro-esophageal reflux disease without esophagitis: Secondary | ICD-10-CM | POA: Insufficient documentation

## 2018-05-23 ENCOUNTER — Other Ambulatory Visit (INDEPENDENT_AMBULATORY_CARE_PROVIDER_SITE_OTHER): Payer: Medicare Other

## 2018-05-23 ENCOUNTER — Ambulatory Visit: Payer: Medicare Other | Admitting: Internal Medicine

## 2018-05-23 ENCOUNTER — Encounter: Payer: Self-pay | Admitting: Internal Medicine

## 2018-05-23 VITALS — BP 124/84 | HR 100 | Ht 68.0 in | Wt 173.0 lb

## 2018-05-23 DIAGNOSIS — R053 Chronic cough: Secondary | ICD-10-CM

## 2018-05-23 DIAGNOSIS — J8489 Other specified interstitial pulmonary diseases: Secondary | ICD-10-CM | POA: Insufficient documentation

## 2018-05-23 DIAGNOSIS — R05 Cough: Secondary | ICD-10-CM | POA: Diagnosis not present

## 2018-05-23 LAB — CBC WITH DIFFERENTIAL/PLATELET
BASOS ABS: 0 10*3/uL (ref 0.0–0.1)
Basophils Relative: 0.4 % (ref 0.0–3.0)
EOS ABS: 0.2 10*3/uL (ref 0.0–0.7)
Eosinophils Relative: 2.4 % (ref 0.0–5.0)
HCT: 41.7 % (ref 39.0–52.0)
Hemoglobin: 14.3 g/dL (ref 13.0–17.0)
LYMPHS ABS: 2.1 10*3/uL (ref 0.7–4.0)
Lymphocytes Relative: 32 % (ref 12.0–46.0)
MCHC: 34.3 g/dL (ref 30.0–36.0)
MCV: 88.5 fl (ref 78.0–100.0)
MONO ABS: 0.5 10*3/uL (ref 0.1–1.0)
Monocytes Relative: 7.4 % (ref 3.0–12.0)
NEUTROS ABS: 3.8 10*3/uL (ref 1.4–7.7)
NEUTROS PCT: 57.8 % (ref 43.0–77.0)
PLATELETS: 234 10*3/uL (ref 150.0–400.0)
RBC: 4.71 Mil/uL (ref 4.22–5.81)
RDW: 14 % (ref 11.5–15.5)
WBC: 6.6 10*3/uL (ref 4.0–10.5)

## 2018-05-23 LAB — SEDIMENTATION RATE: Sed Rate: 19 mm/hr (ref 0–20)

## 2018-05-23 MED ORDER — ESOMEPRAZOLE MAGNESIUM 40 MG PO CPDR: DELAYED_RELEASE_CAPSULE | ORAL | 2 refills | Status: DC

## 2018-05-23 NOTE — Assessment & Plan Note (Addendum)
-  ESR 05/23/2018 pending   He doesn't have any identifiable risk factors for NSIP (collagen vasc dz / HSP eg bird exp/farming)   Will bring him back for pfts in 6 weeks in meantime:  Use of PPI is associated with improved survival time and with decreased radiologic fibrosis per King's study published in AJRCCM vol 184 p1390.  Dec 2011 and also may have other beneficial effects as per the latest review in AJRCCM vol 193 p1345 Jun 20016.  This may not always be cause and effect, but given how universally unimpressive and expensive  all the other  Drugs developed to day  have been for pf,   rec start  rx ppi / diet/ lifestyle modification and f/u with serial walking sats and lung volumes for now to put more points on the curve / establish firm baseline before considering additional measures.   rec nexium 40 mg bid ac and stop boniva for now    Total time devoted to counseling  > 50 % of initial 60 min office visit:  review case with pt/ discussion of options/alternatives/ personally creating written customized instructions  in presence of pt  then going over those specific  Instructions directly with the pt including how to use all of the meds but in particular covering each new medication in detail and the difference between the maintenance= "automatic" meds and the prns using an action plan format for the latter (If this problem/symptom => do that organization reading Left to right).  Please see AVS from this visit for a full list of these instructions which I personally wrote for this pt and  are unique to this visit.  

## 2018-05-23 NOTE — Patient Instructions (Addendum)
Change nexium 40 mg Take 30- 60 min before your first and last meals of the day   Stop boniva and fish oil for now  GERD (REFLUX)  is an extremely common cause of respiratory symptoms just like yours , many times with no obvious heartburn at all.    It can be treated with medication, but also with lifestyle changes including elevation of the head of your bed (ideally with 6 inch  bed blocks),  Smoking cessation, avoidance of late meals, excessive alcohol, and avoid fatty foods, chocolate, peppermint, colas, red wine, and acidic juices such as orange juice.  NO MINT OR MENTHOL PRODUCTS SO NO COUGH DROPS   USE SUGARLESS CANDY INSTEAD (Jolley ranchers or Stover's or Life Savers) or even ice chips will also do - the key is to swallow to prevent all throat clearing. NO OIL BASED VITAMINS - use powdered substitutes.    Please remember to go to the lab department downstairs in the basement  for your tests - we will call you with the results when they are available.      Please schedule a follow up office visit in 6 weeks, call sooner if needed with PFT's on return.

## 2018-05-23 NOTE — Assessment & Plan Note (Addendum)
Allergy profile 05/23/2018 >  Eos 0. /  IgE pending  - 05/23/2018 rec max rx for gerd    Cough p bfast and supper but not noct or with deep breath or activity is most c/w Upper airway cough syndrome (previously labeled PNDS),  is so named because it's frequently impossible to sort out how much is  CR/sinusitis with freq throat clearing (which can be related to primary GERD)   vs  causing  secondary (" extra esophageal")  GERD from wide swings in gastric pressure that occur with throat clearing, often  promoting self use of mint and menthol lozenges that reduce the lower esophageal sphincter tone and exacerbate the problem further in a cyclical fashion.   These are the same pts (now being labeled as having "irritable larynx syndrome" by some cough centers) who not infrequently have a history of having failed to tolerate ace inhibitors,  dry powder inhalers or biphosphonates(like boniva which he thinks he started a few months before the cough started)  or report having atypical/extraesophageal reflux symptoms that don't respond to standard doses of PPI  and are easily confused as having aecopd or asthma flares by even experienced allergists/ pulmonologists (myself included).    rec max rx for gerd/ diet changes and f/u in 6 weeks

## 2018-05-23 NOTE — Progress Notes (Signed)
Charlestine Night, male    DOB: Mar 10, 1931,    MRN: [id number]   Brief patient profile:  51 yowm quit smoker 1973 with new cough in Feb 2019 s typical URI features while on nexium/ boniva with w/u ? PF so referred to pulmonary clinic 05/23/2018 by Dr   Brigitte Pulse.     05/23/2018  Wert/ initial ov re cough since Feb 2019  Chief Complaint  Patient presents with  . Pulmonary Consult    Referred by Dr. Brigitte Pulse. Pt c/o cough since Feb 2019- starting to improve x 3 wks ago but not completely resolved. He only has early am cough now- non prod.   Cough came on abruptly Feb 2019 worse after supper but not sleeping also after bfast  s dysphagia  Has h/o nasal congestion x decades but no worse since onset of cough clariton on maint basis   Not limited by breathing from desired activities  But by knees/ using rollator    No obvious day to day or daytime variability or assoc excess/ purulent sputum or mucus plugs or hemoptysis or cp or chest tightness, subjective wheeze or overt sinus or hb symptoms while on nexium 20 and clariton daily  Sleeping fine flat one pillow without nocturnal  or early am exacerbation  of respiratory  c/o's or need for noct saba. Also denies any obvious fluctuation of symptoms with weather or environmental changes or other aggravating or alleviating factors except as outlined above   No unusual exposure hx or h/o childhood pna/ asthma or knowledge of premature birth.  Current Allergies, Complete Past Medical History, Past Surgical History, Family History, and Social History were reviewed in [employer] record.  ROS  The following are not active complaints unless bolded Hoarseness, sore throat, dysphagia, dental problems, itching, sneezing,  nasal congestion or discharge of excess mucus or purulent secretions, ear ache,   fever, chills, sweats, unintended wt loss or wt gain, classically pleuritic or exertional cp,  orthopnea pnd or arm/hand swelling  or leg  swelling, presyncope, palpitations, abdominal pain, anorexia, nausea, vomiting, diarrhea  or change in bowel habits or change in bladder habits, change in stools or change in urine, dysuria, hematuria,  rash, arthralgias, visual complaints, headache, numbness, weakness or ataxia or problems with walking or coordination,  change in mood or  memory.                Past Medical History:  Diagnosis Date  . Acid reflux   . BPH (benign prostatic hyperplasia)    neg bx. abn PSA  . Hyperlipemia   . Osteoporosis 09/2016   osteoporosis with vertebral fracture    Outpatient Medications Prior to Visit  Medication Sig Dispense Refill  . acetaminophen (TYLENOL) 650 MG CR tablet Take 650 mg by mouth every 8 (eight) hours as needed for pain.    Marland Kitchen aspirin 81 MG tablet Take 81 mg by mouth daily.     . diclofenac sodium (VOLTAREN) 1 % GEL Apply topically 4 (four) times daily.    Marland Kitchen esomeprazole (NEXIUM 24HR) 20 MG capsule Take by mouth.    . ibandronate (BONIVA) 150 MG tablet Take 150 mg by mouth every 30 (thirty) days. Take in the morning with a full glass of water, on an empty stomach, and do not take anything else by mouth or lie down for the next 30 min.    Marland Kitchen loratadine (CLARITIN) 10 MG tablet Take 10 mg by mouth daily.    . Multiple  Vitamins-Minerals (CENTRUM SILVER 50+MEN) TABS Take by mouth.    . naproxen sodium (ALEVE) 220 MG tablet Take 220 mg by mouth 2 (two) times daily with a meal.     . Omega-3 Fatty Acids (FISH OIL) 1000 MG CAPS Take by mouth.    . oxyCODONE-acetaminophen (PERCOCET/ROXICET) 5-325 MG tablet Take 1 tablet by mouth every 6 (six) hours as needed for severe pain. 11 tablet 0  . tamsulosin (FLOMAX) 0.4 MG CAPS capsule Take 0.4 mg by mouth.    . clotrimazole-betamethasone (LOTRISONE) cream Apply 1 application topically 2 (two) times daily. 30 g 0                Objective:     BP 124/84 (BP Location: Left Arm, Cuff Size: Normal)   Pulse 100   Ht _0  (1.727 m)   Wt  173 lb (78.5 kg)   SpO2 96%   BMI 26.30 kg/m   SpO2: 96 % RA  amb wm (with rollator) nad   HEENT: top dentures/  nl turbinates bilaterally, and oropharynx. Nl external ear canals without cough reflex   NECK :  without JVD/Nodes/TM/ nl carotid upstrokes bilaterally   LUNGS: no acc muscle use,  Nl contour chest with distant crackles in bases  bilaterally without cough on insp or exp maneuvers   CV:  RRR  no s3 or murmur or increase in P2, and no edema   ABD:  soft and nontender with nl inspiratory excursion in the supine position. No bruits or organomegaly appreciated, bowel sounds nl  MS:  Nl gait/ ext warm - no calf tenderness, cyanosis or clubbing No obvious joint restrictions / classic djd changes in hands  SKIN: warm and dry without lesions    NEURO:  alert, approp, nl sensorium with  no motor or cerebellar deficits apparent.      I personally reviewed images and agree with radiology impression as follows:   Chest CT w contrast (Not HRCT)  04/08/18  Nonspecific interstitial lung disease. High-resolution CT scan may help with further evaluation.   Labs ordered 05/23/2018   Allergy profile, esr           Assessment   Chronic cough Allergy profile 05/23/2018 >  Eos 0. /  IgE pending  - 05/23/2018 rec max rx for gerd    Cough p bfast and supper but not noct or with deep breath or activity is most c/w Upper airway cough syndrome (previously labeled PNDS),  is so named because it's frequently impossible to sort out how much is  CR/sinusitis with freq throat clearing (which can be related to primary GERD)   vs  causing  secondary (" extra esophageal")  GERD from wide swings in gastric pressure that occur with throat clearing, often  promoting self use of mint and menthol lozenges that reduce the lower esophageal sphincter tone and exacerbate the problem further in a cyclical fashion.   These are the same pts (now being labeled as having "irritable larynx syndrome" by some  cough centers) who not infrequently have a history of having failed to tolerate ace inhibitors,  dry powder inhalers or biphosphonates(like boniva which he thinks he started a few months before the cough started)  or report having atypical/extraesophageal reflux symptoms that don't respond to standard doses of PPI  and are easily confused as having aecopd or asthma flares by even experienced allergists/ pulmonologists (myself included).    rec max rx for gerd/ diet changes and f/u in 6 weeks  NSIP (nonspecific interstitial pneumonitis) (HCC) - ESR 05/23/2018 pending   He doesn't have any identifiable risk factors for NSIP (collagen vasc dz / HSP eg bird exp/farming)   Will bring him back for pfts in 6 weeks in meantime:  Use of PPI is associated with improved survival time and with decreased radiologic fibrosis per King's study published in AJRCCM vol 184 p1390.  Dec 2011 and also may have other beneficial effects as per the latest review in [place] vol 193 B7628 Jun 20016.  This may not always be cause and effect, but given how universally unimpressive and expensive  all the other  Drugs developed to day  have been for pf,   rec start  rx ppi / diet/ lifestyle modification and f/u with serial walking sats and lung volumes for now to put more points on the curve / establish firm baseline before considering additional measures.   rec nexium 40 mg bid ac and stop boniva for now    Total time devoted to counseling  > 50 % of initial 60 min office visit:  review case with pt/ discussion of options/alternatives/ personally creating written customized instructions  in presence of pt  then going over those specific  Instructions directly with the pt including how to use all of the meds but in particular covering each new medication in detail and the difference between the maintenance= "automatic" meds and the prns using an action plan format for the latter (If this problem/symptom => do that organization  reading Left to right).  Please see AVS from this visit for a full list of these instructions which I personally wrote for this pt and  are unique to this visit.      Christinia Gully, MD 05/23/2018

## 2018-05-24 LAB — RESPIRATORY ALLERGY PROFILE REGION II ~~LOC~~
Allergen, Cedar tree, t12: <0.1 kU/L
Allergen, D pternoyssinus,d7: <0.1 kU/L
Allergen, Mouse Urine Protein, e78: <0.1 kU/L
Allergen, Mulberry, t76: <0.1 kU/L
Allergen, Oak,t7: <0.1 kU/L
Aspergillus fumigatus, m3: <0.1 kU/L
CLASS: 0
CLASS: 0
CLASS: 0
CLASS: 0
CLASS: 0
CLASS: 0
CLASS: 0
CLASS: 0
CLASS: 0
CLASS: 0
CLASS: 0
CLASS: 0
Cat Dander: <0.1 kU/L
Class: 0
Class: 0
Class: 0
Class: 0
Class: 0
Class: 0
Class: 0
Class: 0
Class: 0
Class: 0
Class: 0
Class: 0
Cockroach: <0.1 kU/L
D. farinae: <0.1 kU/L
Elm IgE: <0.1 kU/L
IGE (IMMUNOGLOBULIN E), SERUM: 77 kU/L (ref <=114)
Rough Pigweed  IgE: <0.1 kU/L
Timothy Grass: <0.1 kU/L

## 2018-05-24 LAB — INTERPRETATION:

## 2018-05-25 ENCOUNTER — Encounter: Payer: Self-pay | Admitting: Podiatry

## 2018-05-25 ENCOUNTER — Ambulatory Visit (INDEPENDENT_AMBULATORY_CARE_PROVIDER_SITE_OTHER): Payer: Medicare Other | Admitting: Podiatry

## 2018-05-25 DIAGNOSIS — B351 Tinea unguium: Secondary | ICD-10-CM | POA: Diagnosis not present

## 2018-05-25 DIAGNOSIS — M79676 Pain in unspecified toe(s): Secondary | ICD-10-CM

## 2018-05-25 DIAGNOSIS — M79674 Pain in right toe(s): Principal | ICD-10-CM

## 2018-05-25 NOTE — Progress Notes (Signed)
Patient ID: Steven [name], male   DOB: 24-Aug-1931, 84 y.o.   MRN: [id number] Complaint:  Visit Type: Patient returns to my office for continued preventative foot care services. Complaint: Patient states" my nails have grown long and thick and become painful to walk and wear shoes" . The patient presents for preventative foot care services. No changes to ROS.  No pain or itching noted.  Podiatric Exam: Vascular: dorsalis pedis and posterior tibial pulses are palpable bilateral. Capillary return is immediate. Temperature gradient is WNL. Skin turgor WNL  Sensorium: Normal Semmes Weinstein monofilament test. Normal tactile sensation bilaterally. Nail Exam: Pt has thick disfigured discolored nails with subungual debris noted bilateral entire nail hallux through fifth toenails Ulcer Exam: There is no evidence of ulcer or pre-ulcerative changes or infection. Orthopedic Exam: Muscle tone and strength are WNL. No limitations in general ROM. No crepitus or effusions noted. Foot type and digits show no abnormalities. Bony prominences are sub 5th  B/L. Skin:  . No infection or ulcers.    Diagnosis:  Onychomycosis, , Pain in right toe, pain in left toe  Treatment & Plan Procedures and Treatment: Consent by patient was obtained for treatment procedures. The patient understood the discussion of treatment and procedures well. All questions were answered thoroughly reviewed. Debridement of mycotic and hypertrophic toenails, 1 through 5 bilateral and clearing of subungual debris. No ulceration, no infection noted. . Return Visit-Office Procedure: Patient instructed to return to the office for a follow up visit 10 weeks for continued evaluation and treatment.   Gardiner Barefoot DPM

## 2018-05-25 NOTE — Progress Notes (Signed)
LMOM with results ok per [facility]

## 2018-07-01 ENCOUNTER — Other Ambulatory Visit: Payer: Self-pay | Admitting: Internal Medicine

## 2018-07-01 DIAGNOSIS — R053 Chronic cough: Secondary | ICD-10-CM

## 2018-07-01 DIAGNOSIS — R05 Cough: Secondary | ICD-10-CM

## 2018-07-01 NOTE — Progress Notes (Unsigned)
pft  

## 2018-07-04 ENCOUNTER — Ambulatory Visit (INDEPENDENT_AMBULATORY_CARE_PROVIDER_SITE_OTHER): Payer: Medicare Other | Admitting: *Deleted

## 2018-07-04 ENCOUNTER — Encounter: Payer: Self-pay | Admitting: Internal Medicine

## 2018-07-04 ENCOUNTER — Ambulatory Visit (INDEPENDENT_AMBULATORY_CARE_PROVIDER_SITE_OTHER): Payer: Medicare Other | Admitting: Internal Medicine

## 2018-07-04 VITALS — BP 122/80 | HR 103 | Ht 68.0 in | Wt 171.6 lb

## 2018-07-04 DIAGNOSIS — R05 Cough: Secondary | ICD-10-CM

## 2018-07-04 DIAGNOSIS — J8489 Other specified interstitial pulmonary diseases: Secondary | ICD-10-CM

## 2018-07-04 DIAGNOSIS — R0609 Other forms of dyspnea: Secondary | ICD-10-CM

## 2018-07-04 DIAGNOSIS — R0600 Dyspnea, unspecified: Secondary | ICD-10-CM

## 2018-07-04 DIAGNOSIS — R053 Chronic cough: Secondary | ICD-10-CM

## 2018-07-04 NOTE — Progress Notes (Signed)
Steven [name], male    DOB: [birth year],    MRN: [id number]   Brief patient profile:  26 yowm quit smoker 1973 with new cough in Feb 2019 s typical URI features while on nexium/ boniva with w/u ? PF so referred to pulmonary clinic 05/23/2018 by Dr   Brigitte Pulse.    History of Present Illness  05/23/2018  Steven [name]/ initial ov re cough since Feb 2019  Chief Complaint  Patient presents with  . Pulmonary Consult    Referred by Dr. Brigitte Pulse. Pt c/o cough since Feb 2019- starting to improve x 3 wks ago but not completely resolved. He only has early am cough now- non prod.   Cough came on abruptly Feb 2019 worse after supper but not sleeping also after bfast  s dysphagia  Has h/o nasal congestion x decades but no worse since onset of cough clariton on maint basis  Not limited by breathing from desired activities  But by knees/ using rollator  rec Change nexium 40 mg Take 30- 60 min before your first and last meals of the day  Stop boniva and fish oil for now GERD diet     Please schedule a follow up office visit in 6 weeks, call sooner if needed with PFT's on return.    07/04/2018  f/u ov/[name] re: cough  Chief Complaint  Patient presents with  . Follow-up    6MW performed today.  Pt states he has been doing good since last visit. States he is still coughing and states the cough is improved.  Dyspnea:  Legs stop him first now Cough: improved Sleeping: pillows up 20 degrees  SABA use: none  02: none     No obvious day to day or daytime variability or assoc excess/ purulent sputum or mucus plugs or hemoptysis or cp or chest tightness, subjective wheeze or overt sinus or hb symptoms.   Sleeping as above ok  without nocturnal  or early am exacerbation  of respiratory  c/o's or need for noct saba. Also denies any obvious fluctuation of symptoms with weather or environmental changes or other aggravating or alleviating factors except as outlined above   No unusual exposure hx or h/o childhood pna/ asthma or  knowledge of premature birth.  Current Allergies, Complete Past Medical History, Past Surgical History, Family History, and Social History were reviewed in [facility] record.  ROS  The following are not active complaints unless bolded Hoarseness, sore throat, dysphagia, dental problems, itching, sneezing,  nasal congestion or discharge of excess mucus or purulent secretions, ear ache,   fever, chills, sweats, unintended wt loss or wt gain, classically pleuritic or exertional cp,  orthopnea pnd or arm/hand swelling  or leg swelling, presyncope, palpitations, abdominal pain, anorexia, nausea, vomiting, diarrhea  or change in bowel habits or change in bladder habits, change in stools or change in urine, dysuria, hematuria,  rash, arthralgias, visual complaints, headache, numbness, weakness or ataxia or problems with walking or coordination,  change in mood or  memory.        Current Meds  Medication Sig  . acetaminophen (TYLENOL) 650 MG CR tablet Take 650 mg by mouth every 8 (eight) hours as needed for pain.  Marland Kitchen aspirin 81 MG tablet Take 81 mg by mouth daily.   . diclofenac sodium (VOLTAREN) 1 % GEL Apply topically 4 (four) times daily.  Marland Kitchen esomeprazole (NEXIUM) 40 MG capsule Take 30- 60 min before your first and last meals of the day  .  loratadine (CLARITIN) 10 MG tablet Take 10 mg by mouth daily.  . Multiple Vitamins-Minerals (CENTRUM SILVER 50+MEN) TABS Take by mouth.  . naproxen sodium (ALEVE) 220 MG tablet Take 220 mg by mouth 2 (two) times daily with a meal.   . oxyCODONE-acetaminophen (PERCOCET/ROXICET) 5-325 MG tablet Take 1 tablet by mouth every 6 (six) hours as needed for severe pain.  . tamsulosin (FLOMAX) 0.4 MG CAPS capsule Take 0.4 mg by mouth.                    Objective:     amb wm nad  Wt Readings from Last 3 Encounters:  07/04/18 171 lb 9.6 oz (77.8 kg)  05/23/18 173 lb (78.5 kg)  06/14/16 168 lb (76.2 kg)     Vital signs reviewed - Note  on arrival 02 sats  95% on RA      HEENT: Top dentures/  nl  turbinates bilaterally, and oropharynx. Nl external ear canals without cough reflex   NECK :  without JVD/Nodes/TM/ nl carotid upstrokes bilaterally   LUNGS: no acc muscle use,  Nl contour chest with minimal crackles in bases  bilaterally without cough on insp or exp maneuvers   CV:  RRR  no s3 or murmur or increase in P2, and no edema   ABD:  soft and nontender with nl inspiratory excursion in the supine position. No bruits or organomegaly appreciated, bowel sounds nl  MS:  Nl gait/ ext warm with bilateral djd changes both hands  calf tenderness, cyanosis or clubbing No obvious joint restrictions   SKIN: warm and dry without lesions    NEURO:  alert, approp, nl sensorium with  no motor or cerebellar deficits apparent.          I personally reviewed images and agree with radiology impression as follows:   Chest CT w contrast (Not HRCT)  04/08/18  Nonspecific interstitial lung disease. High-resolution CT scan may help with further evaluation.             Assessment

## 2018-07-04 NOTE — Patient Instructions (Signed)
Nexium  40 mg   Take  30-60 min before first meal of the day and Pepcid (famotidine)  20 mg one @  Supper  until return to office - this is the best way to tell whether stomach acid is contributing to your problem  No other changes      Please schedule a follow up office visit in 6 weeks, call sooner if needed with   PFTs on return.

## 2018-07-04 NOTE — Progress Notes (Signed)
SIX MIN WALK 07/04/2018  Medications Aleve 220mg ,Nexium 40mg ,Claritin 10mg   Supplimental Oxygen during Test? (L/min) No  Laps 5  Partial Lap (in Meters) 0  Baseline BP (sitting) 118/82  Baseline Heartrate 103  Baseline Dyspnea (Borg Scale) 1  Baseline Fatigue (Borg Scale) 1  Baseline SPO2 94  BP (sitting) 130/90  Heartrate 116  Dyspnea (Borg Scale) 2  Fatigue (Borg Scale) 1  SPO2 96  BP (sitting) 126/88  Heartrate 104  SPO2 97  Stopped or Paused before Six Minutes No  Interpretation Leg pain  Distance Completed 240  Tech Comments: Had leg pain in the last 2 minutes, an used a walker.

## 2018-07-05 ENCOUNTER — Encounter: Payer: Self-pay | Admitting: Internal Medicine

## 2018-07-05 NOTE — Assessment & Plan Note (Addendum)
CT 04/08/18   - ESR 05/23/2018 = 19 - 07/04/2018 6 m walk x 240 min , slowed by leg pain not sob, no desats      No evidence of a collagen vasc process or HSP risk > return for pfts as rec previoulsy   Use of PPI is associated with improved survival time and with decreased radiologic fibrosis per King's study published in AJRCCM vol 184 p1390.  Dec 2011 and also may have other beneficial effects as per the latest review in [facility] vol 193 Y5694 Jun 20016.  This may not always be cause and effect, but given how universally unimpressive and expensive  all the other  Drugs developed to day  have been for pf,   rec start continue  ppi / diet/ lifestyle modification and f/u with serial walking sats and lung volumes for now to put more points on the curve / establish firm baseline before considering additional measures.    Discussed in detail all the  indications, usual  risks and alternatives  relative to the benefits with patient who agrees to proceed with conservative f/u as outlined     I had an extended discussion with the patient reviewing all relevant studies completed to date and  lasting 15 to 20 minutes of a 25 minute visit    Each maintenance medication was reviewed in detail including most importantly the difference between maintenance and prns and under what circumstances the prns are to be triggered using an action plan format that is not reflected in the computer generated alphabetically organized AVS.     Please see AVS for specific instructions unique to this visit that I personally wrote and verbalized to the the pt in detail and then reviewed with pt  by my nurse highlighting any  changes in therapy recommended at today's visit to their plan of care.

## 2018-07-05 NOTE — Assessment & Plan Note (Signed)
Allergy profile 05/23/2018 >  Eos 0.2 /  IgE 77 RAST neg - 05/23/2018 rec max rx for gerd > improved 07/04/2018   Ok to reduce ppi to qam and use h2 hs and see if still maintains same level of control s need for cough meds

## 2018-08-05 ENCOUNTER — Ambulatory Visit (INDEPENDENT_AMBULATORY_CARE_PROVIDER_SITE_OTHER): Payer: Medicare Other | Admitting: Podiatry

## 2018-08-05 ENCOUNTER — Encounter: Payer: Self-pay | Admitting: Podiatry

## 2018-08-05 DIAGNOSIS — M79674 Pain in right toe(s): Secondary | ICD-10-CM

## 2018-08-05 DIAGNOSIS — B351 Tinea unguium: Secondary | ICD-10-CM

## 2018-08-05 NOTE — Progress Notes (Signed)
Patient ID: Steven [name], male   DOB: [birth year], 84 y.o.   MRN: [id number] Complaint:  Visit Type: Patient returns to my office for continued preventative foot care services. Complaint: Patient states" my nails have grown long and thick and become painful to walk and wear shoes" . The patient presents for preventative foot care services. No changes to ROS.  No pain or itching noted.  Podiatric Exam: Vascular: dorsalis pedis and posterior tibial pulses are palpable bilateral. Capillary return is immediate. Temperature gradient is WNL. Skin turgor WNL  Sensorium: Normal Semmes Weinstein monofilament test. Normal tactile sensation bilaterally. Nail Exam: Pt has thick disfigured discolored nails with subungual debris noted bilateral entire nail hallux through fifth toenails Ulcer Exam: There is no evidence of ulcer or pre-ulcerative changes or infection. Orthopedic Exam: Muscle tone and strength are WNL. No limitations in general ROM. No crepitus or effusions noted. Foot type and digits show no abnormalities. Bony prominences are sub 5th  B/L. Skin:  . No infection or ulcers.    Diagnosis:  Onychomycosis, , Pain in right toe, pain in left toe  Treatment & Plan Procedures and Treatment: Consent by patient was obtained for treatment procedures. The patient understood the discussion of treatment and procedures well. All questions were answered thoroughly reviewed. Debridement of mycotic and hypertrophic toenails, 1 through 5 bilateral and clearing of subungual debris. No ulceration, no infection noted. . Return Visit-Office Procedure: Patient instructed to return to the office for a follow up visit 10 weeks for continued evaluation and treatment.   Gardiner Barefoot DPM

## 2018-08-15 ENCOUNTER — Ambulatory Visit: Payer: Medicare Other

## 2018-08-15 ENCOUNTER — Ambulatory Visit (INDEPENDENT_AMBULATORY_CARE_PROVIDER_SITE_OTHER)
Admission: RE | Admit: 2018-08-15 | Discharge: 2018-08-15 | Disposition: A | Discharge status: Home / Self Care | Payer: Medicare Other | Source: Ambulatory Visit | Attending: Internal Medicine | Admitting: Internal Medicine

## 2018-08-15 ENCOUNTER — Other Ambulatory Visit: Payer: Medicare Other

## 2018-08-15 ENCOUNTER — Ambulatory Visit (INDEPENDENT_AMBULATORY_CARE_PROVIDER_SITE_OTHER): Payer: Medicare Other | Admitting: Internal Medicine

## 2018-08-15 ENCOUNTER — Encounter: Payer: Self-pay | Admitting: Internal Medicine

## 2018-08-15 VITALS — BP 118/80 | HR 80 | Ht 68.0 in | Wt 170.0 lb

## 2018-08-15 DIAGNOSIS — R05 Cough: Secondary | ICD-10-CM | POA: Diagnosis not present

## 2018-08-15 DIAGNOSIS — J8489 Other specified interstitial pulmonary diseases: Secondary | ICD-10-CM | POA: Diagnosis not present

## 2018-08-15 DIAGNOSIS — R053 Chronic cough: Secondary | ICD-10-CM

## 2018-08-15 NOTE — Progress Notes (Signed)
Steven [name], male    DOB: [birth year],    MRN: [id number]   Brief patient profile:  19 yowm quit smoker 1973 with new cough in Feb 2019 s typical URI features while on nexium/ boniva with w/u ? PF so referred to pulmonary clinic 05/23/2018 by Dr   Brigitte Pulse.    History of Present Illness  05/23/2018  Steven [name]/ initial ov re cough since Feb 2019  Chief Complaint  Patient presents with  . Pulmonary Consult    Referred by Dr. Brigitte Pulse. Pt c/o cough since Feb 2019- starting to improve x 3 wks ago but not completely resolved. He only has early am cough now- non prod.   Cough came on abruptly Feb 2019 worse after supper but not sleeping also after bfast  s dysphagia  Has h/o nasal congestion x decades but no worse since onset of cough clariton on maint basis  Not limited by breathing from desired activities  But by knees/ using rollator  rec Change nexium 40 mg Take 30- 60 min before your first and last meals of the day  Stop boniva and fish oil for now GERD diet     Please schedule a follow up office visit in 6 weeks, call sooner if needed with PFT's on return.    07/04/2018  f/u ov/Steven [name] re: cough  Chief Complaint  Patient presents with  . Follow-up    6MW performed today.  Pt states he has been doing good since last visit. States he is still coughing and states the cough is improved.  Dyspnea:  Legs stop him first now Cough: improved Sleeping: pillows up 20 degrees  SABA use: none  02: none   rec Nexium  40 mg   Take  30-60 min before first meal of the day and Pepcid (famotidine)  20 mg one @  Supper  until return to office - this is the best way to tell whether stomach acid is contributing to your problem No other changes      08/15/2018  f/u ov/Steven [name] re: cough resolved ? PF   / severe leg weakness > "[facility]tek says it's from nerve damage"  Chief Complaint  Patient presents with  . Follow-up    Cough is much improved. No new co's today.    Dyspnea:  Legs stop him first/ able to do [employer]  slow pace if has cart to push and keep him from falling  Cough: gone Sleeping:  Bed flat/ 2 pillows  SABA use: none 02: no     No obvious day to day or daytime variability or assoc excess/ purulent sputum or mucus plugs or hemoptysis or cp or chest tightness, subjective wheeze or overt sinus or hb symptoms.   Sleeping as above without nocturnal  or early am exacerbation  of respiratory  c/o's or need for noct saba. Also denies any obvious fluctuation of symptoms with weather or environmental changes or other aggravating or alleviating factors except as outlined above   No unusual exposure hx or h/o childhood pna/ asthma or knowledge of premature birth.  Current Allergies, Complete Past Medical History, Past Surgical History, Family History, and Social History were reviewed in [facility] [facility] record.  ROS  The following are not active complaints unless bolded Hoarseness, sore throat, dysphagia, dental problems, itching, sneezing,  nasal congestion or discharge of excess mucus or purulent secretions, ear ache,   fever, chills, sweats, unintended wt loss or wt gain, classically pleuritic or exertional cp,  orthopnea pnd  or arm/hand swelling  or leg swelling, presyncope, palpitations, abdominal pain, anorexia, nausea, vomiting, diarrhea  or change in bowel habits or change in bladder habits, change in stools or change in urine, dysuria, hematuria,  rash, arthralgias, visual complaints, headache, numbness, weakness or ataxia or problems with walking or coordination,  change in mood or  memory.        Current Meds  Medication Sig  . acetaminophen (TYLENOL) 650 MG CR tablet Take 650 mg by mouth every 8 (eight) hours as needed for pain.  Marland Kitchen aspirin 81 MG tablet Take 81 mg by mouth daily.   . diclofenac sodium (VOLTAREN) 1 % GEL Apply topically 4 (four) times daily.  Marland Kitchen esomeprazole (NEXIUM) 40 MG capsule Take 30- 60 min before your first and last meals of the day  . loratadine  (CLARITIN) 10 MG tablet Take 10 mg by mouth daily.  . Multiple Vitamins-Minerals (CENTRUM SILVER 50+MEN) TABS Take by mouth.  . naproxen sodium (ALEVE) 220 MG tablet Take 220 mg by mouth 2 (two) times daily with a meal.   . oxyCODONE-acetaminophen (PERCOCET/ROXICET) 5-325 MG tablet Take 1 tablet by mouth every 6 (six) hours as needed for severe pain.  . tamsulosin (FLOMAX) 0.4 MG CAPS capsule Take 0.4 mg by mouth.          Objective:    amb wm with extreme difficulty standing due to leg weakness / using rollator stooped over to ambulate   08/15/2018        170  07/04/18 171 lb 9.6 oz (77.8 kg)  05/23/18 173 lb (78.5 kg)  06/14/16 168 lb (76.2 kg)     Vital signs reviewed - Note on arrival 02 sats  96% on RA            HEENT:  Top dentures/  nl   turbinates bilaterally, and oropharynx. Nl external ear canals without cough reflex   NECK :  without JVD/Nodes/TM/ nl carotid upstrokes bilaterally   LUNGS: no acc muscle use,  Nl contour chest min basilar insp crackles  bilaterally without cough on insp or exp maneuvers   CV:  RRR  no s3 or murmur or increase in P2, and no edema   ABD:  soft and nontender with nl inspiratory excursion in the supine position. No bruits or organomegaly appreciated, bowel sounds nl  MS:   ext warm with classic djd changes both hand , calf tenderness, cyanosis or clubbing No obvious joint restrictions   SKIN: warm and dry without lesions    NEURO:  alert, approp, nl sensorium with  no motor or cerebellar deficits apparent.           CXR PA and Lateral:   08/15/2018 :    I personally reviewed images and agree with radiology impression as follows:   Chronic interstitial changes slightly improved as compared to the previous study. There is no pneumonia, CHF, nor other acute cardiopulmonary abnormality.             Assessment

## 2018-08-15 NOTE — Patient Instructions (Addendum)
Nexium  40 mg   Take  30-60 min before first meal of the day and Pepcid (famotidine)  20 mg one after Supper  until return to office - this is the best way to tell whether stomach acid is contributing to your problem  ( or take nexium 20 mg x 30 before supper)   Stay as active as you can and monitor your 02 levels by pulse oximetry and  if you start to notice a consistent pattern where your 02 saturations drop with exertion then call for follow up as soon as possible.    Please remember to go to the  x-ray department downstairs in the basement  for your tests - we will call you with the results when they are available.      Pulmonary follow up is as needed

## 2018-08-15 NOTE — Progress Notes (Signed)
Left detailed msg ok per DPR

## 2018-08-16 ENCOUNTER — Encounter: Payer: Self-pay | Admitting: Internal Medicine

## 2018-08-16 ENCOUNTER — Other Ambulatory Visit: Payer: Self-pay | Admitting: Internal Medicine

## 2018-08-16 ENCOUNTER — Telehealth: Payer: Self-pay | Admitting: Internal Medicine

## 2018-08-16 MED ORDER — ESOMEPRAZOLE MAGNESIUM 40 MG PO CPDR: DELAYED_RELEASE_CAPSULE | ORAL | 1 refills | Status: AC

## 2018-08-16 MED ORDER — FAMOTIDINE 20 MG PO TABS: 20.0000 mg | ORAL_TABLET | Freq: Every day | ORAL | 1 refills | Status: AC

## 2018-08-16 NOTE — Assessment & Plan Note (Signed)
CT 04/08/18  ? NSIP  - ESR 05/23/2018 = 19 - 07/04/2018 6 m walk x 240 m  , slowed by leg pain not sob, no desats    I had an extended final summary discussion with the patient reviewing all relevant studies completed to date and  lasting 15 to 20 minutes of a 25 minute visit on the following issues:   Clearly if he has ILD at all he is more slowed by orthopedic/ neurologic issues at this point plus severe deconditioning.  NSIP is per se not a specific dx and note cxr has improved s rx so doubt he has any form or progessive ILD  Best way to monitor is check sats over time during exertion   Pulmonary f/u can be prn    Each maintenance medication was reviewed in detail including most importantly the difference between maintenance and as needed and under what circumstances the prns are to be used.  Please see AVS for specific  Instructions which are unique to this visit and I personally typed out  which were reviewed in detail in writing with the patient and a copy provided.

## 2018-08-16 NOTE — Assessment & Plan Note (Signed)
Allergy profile 05/23/2018 >  Eos 0.2 /  IgE 77 RAST neg - 05/23/2018 rec max rx for gerd > improved 07/04/2018 and ILD changes on cxr improved as of 08/15/2018   Since he improved on gerd rx and has ? Underlying ild rec he maint on nexium q am / pepcid q pm for now but consider to weaning to pepcid bid p 3 months rx based on  the recent press about ppi's in the context of a statistically significant (but questionably clinically relevant) increase in CRI in pts on ppi vs h2's > bottom line is the lowest dose of ppi that controls   gerd is the right dose and if that dose is zero that's fine esp since h2's are cheaper.   F/u can be per Brigitte Pulse or GI at Dr Raul Del discretion

## 2018-08-16 NOTE — Telephone Encounter (Signed)
Called and spoke with pharmacist, patient is supposed to take nexium before the first meal of the day and pepcid at bedtime. Medication changed. Nothing further needed.

## 2018-08-19 LAB — PULMONARY FUNCTION TEST
FEF 25-75 Pre: 0.79 L/sec
FEF2575-%Pred-Pre: 52 %
FEV1-%Pred-Pre: 48 %
FEV1-Pre: 1.16 L
FEV1FVC-%Pred-Pre: 104 %
FEV6-%PRED-PRE: 49 %
FEV6-Pre: 1.58 L
FEV6FVC-%PRED-PRE: 108 %
FVC-%PRED-PRE: 45 %
FVC-PRE: 1.58 L
PRE FEV6/FVC RATIO: 100 %
Pre FEV1/FVC ratio: 73 %

## 2018-10-21 ENCOUNTER — Encounter: Payer: Self-pay | Admitting: Podiatry

## 2018-10-21 ENCOUNTER — Ambulatory Visit (INDEPENDENT_AMBULATORY_CARE_PROVIDER_SITE_OTHER): Payer: Medicare Other | Admitting: Podiatry

## 2018-10-21 DIAGNOSIS — B351 Tinea unguium: Secondary | ICD-10-CM | POA: Diagnosis not present

## 2018-10-21 DIAGNOSIS — M79674 Pain in right toe(s): Secondary | ICD-10-CM

## 2018-10-21 NOTE — Progress Notes (Signed)
Patient ID: JENSYN CAMBRIA, male   DOB: 1931/06/13, 84 y.o.   MRN: [id number] Complaint:  Visit Type: Patient returns to my office for continued preventative foot care services. Complaint: Patient states" my nails have grown long and thick and become painful to walk and wear shoes" . The patient presents for preventative foot care services. No changes to ROS.    Podiatric Exam: Vascular: dorsalis pedis and posterior tibial pulses are palpable bilateral. Capillary return is immediate. Temperature gradient is WNL. Skin turgor WNL  Sensorium: Normal Semmes Weinstein monofilament test. Normal tactile sensation bilaterally. Nail Exam: Pt has thick disfigured discolored nails with subungual debris noted bilateral entire nail hallux through fifth toenails Ulcer Exam: There is no evidence of ulcer or pre-ulcerative changes or infection. Orthopedic Exam: Muscle tone and strength are WNL. No limitations in general ROM. No crepitus or effusions noted. Foot type and digits show no abnormalities. Bony prominences are sub 5th  B/L. Skin:  . No infection or ulcers.  Asymptomatic callus sub 5th met left foot.  Diagnosis:  Onychomycosis, , Pain in right toe, pain in left toe  Treatment & Plan Procedures and Treatment: Consent by patient was obtained for treatment procedures. The patient understood the discussion of treatment and procedures well. All questions were answered thoroughly reviewed. Debridement of mycotic and hypertrophic toenails, 1 through 5 bilateral and clearing of subungual debris. No ulceration, no infection noted. . Return Visit-Office Procedure: Patient instructed to return to the office for a follow up visit 10 weeks for continued evaluation and treatment.   Gardiner Barefoot DPM

## 2018-11-18 DIAGNOSIS — E7800 Pure hypercholesterolemia, unspecified: Secondary | ICD-10-CM | POA: Diagnosis not present

## 2018-11-18 DIAGNOSIS — Z0000 Encounter for general adult medical examination without abnormal findings: Secondary | ICD-10-CM | POA: Diagnosis not present

## 2018-11-18 DIAGNOSIS — M8580 Other specified disorders of bone density and structure, unspecified site: Secondary | ICD-10-CM | POA: Diagnosis not present

## 2018-11-18 DIAGNOSIS — J301 Allergic rhinitis due to pollen: Secondary | ICD-10-CM | POA: Diagnosis not present

## 2018-11-18 DIAGNOSIS — M4800 Spinal stenosis, site unspecified: Secondary | ICD-10-CM | POA: Diagnosis not present

## 2018-11-18 DIAGNOSIS — K219 Gastro-esophageal reflux disease without esophagitis: Secondary | ICD-10-CM | POA: Diagnosis not present

## 2018-12-05 DIAGNOSIS — J069 Acute upper respiratory infection, unspecified: Secondary | ICD-10-CM | POA: Diagnosis not present

## 2018-12-14 DIAGNOSIS — R69 Illness, unspecified: Secondary | ICD-10-CM | POA: Diagnosis not present

## 2018-12-30 ENCOUNTER — Other Ambulatory Visit: Payer: Self-pay

## 2018-12-30 ENCOUNTER — Encounter: Payer: Self-pay | Admitting: Podiatry

## 2018-12-30 ENCOUNTER — Ambulatory Visit (INDEPENDENT_AMBULATORY_CARE_PROVIDER_SITE_OTHER): Payer: Medicare HMO | Admitting: Podiatry

## 2018-12-30 DIAGNOSIS — B351 Tinea unguium: Secondary | ICD-10-CM

## 2018-12-30 DIAGNOSIS — M79675 Pain in left toe(s): Secondary | ICD-10-CM

## 2018-12-30 NOTE — Progress Notes (Signed)
Patient ID: Steven [name], male   DOB: 08/24/1931, 85 y.o.   MRN: [id number] Complaint:  Visit Type: Patient returns to my office for continued preventative foot care services. Complaint: Patient states" my nails have grown long and thick and become painful to walk and wear shoes" . The patient presents for preventative foot care services. No changes to ROS.    Podiatric Exam: Vascular: dorsalis pedis and posterior tibial pulses are palpable bilateral. Capillary return is immediate. Temperature gradient is WNL. Skin turgor WNL  Sensorium: Normal Semmes Weinstein monofilament test. Normal tactile sensation bilaterally. Nail Exam: Pt has thick disfigured discolored nails with subungual debris noted bilateral entire nail hallux through fifth toenails Ulcer Exam: There is no evidence of ulcer or pre-ulcerative changes or infection. Orthopedic Exam: Muscle tone and strength are WNL. No limitations in general ROM. No crepitus or effusions noted. Foot type and digits show no abnormalities. Bony prominences are sub 5th  B/L. Skin:  . No infection or ulcers.  Asymptomatic callus sub 5th met left foot.  Diagnosis:  Onychomycosis, , Pain in right toe, pain in left toe  Treatment & Plan Procedures and Treatment: Consent by patient was obtained for treatment procedures. The patient understood the discussion of treatment and procedures well. All questions were answered thoroughly reviewed. Debridement of mycotic and hypertrophic toenails, 1 through 5 bilateral and clearing of subungual debris. No ulceration, no infection noted. . Return Visit-Office Procedure: Patient instructed to return to the office for a follow up visit 10 weeks for continued evaluation and treatment.   Ulani Degrasse DPM 

## 2019-02-20 DIAGNOSIS — R29818 Other symptoms and signs involving the nervous system: Secondary | ICD-10-CM | POA: Diagnosis not present

## 2019-03-10 ENCOUNTER — Other Ambulatory Visit: Payer: Self-pay

## 2019-03-10 ENCOUNTER — Ambulatory Visit: Payer: Medicare HMO | Admitting: Podiatry

## 2019-03-10 ENCOUNTER — Encounter: Payer: Self-pay | Admitting: Podiatry

## 2019-03-10 DIAGNOSIS — B351 Tinea unguium: Secondary | ICD-10-CM | POA: Diagnosis not present

## 2019-03-10 DIAGNOSIS — M79675 Pain in left toe(s): Secondary | ICD-10-CM

## 2019-03-10 NOTE — Progress Notes (Signed)
Patient ID: Steven [name], male   DOB: [birth year], 85 y.o.   MRN: [id number] Complaint:  Visit Type: Patient returns to my office for continued preventative foot care services. Complaint: Patient states" my nails have grown long and thick and become painful to walk and wear shoes" . The patient presents for preventative foot care services. No changes to ROS.    Podiatric Exam: Vascular: dorsalis pedis and posterior tibial pulses are palpable bilateral. Capillary return is immediate. Temperature gradient is WNL. Skin turgor WNL  Sensorium: Normal Semmes Weinstein monofilament test. Normal tactile sensation bilaterally. Nail Exam: Pt has thick disfigured discolored nails with subungual debris noted bilateral entire nail hallux through fifth toenails Ulcer Exam: There is no evidence of ulcer or pre-ulcerative changes or infection. Orthopedic Exam: Muscle tone and strength are WNL. No limitations in general ROM. No crepitus or effusions noted. Foot type and digits show no abnormalities. Bony prominences are sub 5th  B/L. Skin:  . No infection or ulcers.  symptomatic callus sub 5th met left foot.  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  Treatment & Plan Procedures and Treatment: Consent by patient was obtained for treatment procedures. The patient understood the discussion of treatment and procedures well. All questions were answered thoroughly reviewed. Debridement of mycotic and hypertrophic toenails, 1 through 5 bilateral and clearing of subungual debris. No ulceration, no infection noted. .Debride porokeratosis Return Visit-Office Procedure: Patient instructed to return to the office for a follow up visit 10 weeks for continued evaluation and treatment.   Gardiner Barefoot DPM

## 2019-05-19 ENCOUNTER — Ambulatory Visit: Payer: Medicare HMO | Admitting: Podiatry

## 2019-05-19 ENCOUNTER — Other Ambulatory Visit: Payer: Self-pay

## 2019-05-19 ENCOUNTER — Encounter: Payer: Self-pay | Admitting: Podiatry

## 2019-05-19 VITALS — Temp 98.7°F

## 2019-05-19 DIAGNOSIS — B351 Tinea unguium: Secondary | ICD-10-CM

## 2019-05-19 DIAGNOSIS — M79675 Pain in left toe(s): Secondary | ICD-10-CM | POA: Diagnosis not present

## 2019-05-19 NOTE — Progress Notes (Signed)
Patient ID: Steven [name], male   DOB: August 08, 1931, 85 y.o.   MRN: [id number] Complaint:  Visit Type: Patient returns to my office for continued preventative foot care services. Complaint: Patient states" my nails have grown long and thick and become painful to walk and wear shoes" . The patient presents for preventative foot care services. No changes to ROS.    Podiatric Exam: Vascular: dorsalis pedis and posterior tibial pulses are palpable bilateral. Capillary return is immediate. Temperature gradient is WNL. Skin turgor WNL  Sensorium: Normal Semmes Weinstein monofilament test. Normal tactile sensation bilaterally. Nail Exam: Pt has thick disfigured discolored nails with subungual debris noted bilateral entire nail hallux through fifth toenails Ulcer Exam: There is no evidence of ulcer or pre-ulcerative changes or infection. Orthopedic Exam: Muscle tone and strength are WNL. No limitations in general ROM. No crepitus or effusions noted. Foot type and digits show no abnormalities. Bony prominences are sub 5th  B/L. Skin:  . No infection or ulcers.    Diagnosis:  Onychomycosis, , Pain in right toe, pain in left toe  Treatment & Plan Procedures and Treatment: Consent by patient was obtained for treatment procedures. The patient understood the discussion of treatment and procedures well. All questions were answered thoroughly reviewed. Debridement of mycotic and hypertrophic toenails, 1 through 5 bilateral and clearing of subungual debris. No ulceration, no infection noted. . Return Visit-Office Procedure: Patient instructed to return to the office for a follow up visit 10 weeks for continued evaluation and treatment.   Gardiner Barefoot DPM

## 2019-05-28 ENCOUNTER — Other Ambulatory Visit: Payer: Self-pay | Admitting: Internal Medicine

## 2019-06-14 DIAGNOSIS — R69 Illness, unspecified: Secondary | ICD-10-CM | POA: Diagnosis not present

## 2019-07-20 DIAGNOSIS — H26493 Other secondary cataract, bilateral: Secondary | ICD-10-CM | POA: Diagnosis not present

## 2019-07-20 DIAGNOSIS — H01001 Unspecified blepharitis right upper eyelid: Secondary | ICD-10-CM | POA: Diagnosis not present

## 2019-07-20 DIAGNOSIS — H01002 Unspecified blepharitis right lower eyelid: Secondary | ICD-10-CM | POA: Diagnosis not present

## 2019-07-20 DIAGNOSIS — Z961 Presence of intraocular lens: Secondary | ICD-10-CM | POA: Diagnosis not present

## 2019-07-27 ENCOUNTER — Ambulatory Visit
Admission: RE | Admit: 2019-07-27 | Discharge: 2019-07-27 | Disposition: A | Discharge status: Home / Self Care | Payer: Medicare Other | Source: Ambulatory Visit | Attending: Family Medicine | Admitting: Family Medicine

## 2019-07-27 ENCOUNTER — Other Ambulatory Visit: Payer: Self-pay | Admitting: Family Medicine

## 2019-07-27 DIAGNOSIS — M25552 Pain in left hip: Secondary | ICD-10-CM

## 2019-07-27 DIAGNOSIS — T148XXA Other injury of unspecified body region, initial encounter: Secondary | ICD-10-CM | POA: Diagnosis not present

## 2019-07-27 DIAGNOSIS — S79912A Unspecified injury of left hip, initial encounter: Secondary | ICD-10-CM | POA: Diagnosis not present

## 2019-08-02 ENCOUNTER — Ambulatory Visit (INDEPENDENT_AMBULATORY_CARE_PROVIDER_SITE_OTHER): Payer: Self-pay | Admitting: Podiatry

## 2019-08-02 ENCOUNTER — Other Ambulatory Visit: Payer: Self-pay

## 2019-08-02 ENCOUNTER — Encounter: Payer: Self-pay | Admitting: Podiatry

## 2019-08-02 DIAGNOSIS — M25552 Pain in left hip: Secondary | ICD-10-CM | POA: Diagnosis not present

## 2019-08-02 DIAGNOSIS — M79674 Pain in right toe(s): Secondary | ICD-10-CM

## 2019-08-02 DIAGNOSIS — B351 Tinea unguium: Secondary | ICD-10-CM

## 2019-08-02 NOTE — Progress Notes (Signed)
Patient ID: Steven [name], male   DOB: 03/30/1931, 85 y.o.   MRN: [id number] Complaint:  Visit Type: Patient returns to my office for continued preventative foot care services. Complaint: Patient states" my nails have grown long and thick and become painful to walk and wear shoes" . The patient presents for preventative foot care services. No changes to ROS.    Podiatric Exam: Vascular: dorsalis pedis and posterior tibial pulses are palpable bilateral. Capillary return is immediate. Temperature gradient is WNL. Skin turgor WNL  Sensorium: Normal Semmes Weinstein monofilament test. Normal tactile sensation bilaterally. Nail Exam: Pt has thick disfigured discolored nails with subungual debris noted bilateral entire nail hallux through fifth toenails Ulcer Exam: There is no evidence of ulcer or pre-ulcerative changes or infection. Orthopedic Exam: Muscle tone and strength are WNL. No limitations in general ROM. No crepitus or effusions noted. Foot type and digits show no abnormalities. Bony prominences are sub 5th  B/L. Skin:  . No infection or ulcers.    Diagnosis:  Onychomycosis, , Pain in right toe, pain in left toe  Treatment & Plan Procedures and Treatment: Consent by patient was obtained for treatment procedures. The patient understood the discussion of treatment and procedures well. All questions were answered thoroughly reviewed. Debridement of mycotic and hypertrophic toenails, 1 through 5 bilateral and clearing of subungual debris. No ulceration, no infection noted. . Return Visit-Office Procedure: Patient instructed to return to the office for a follow up visit 10 weeks for continued evaluation and treatment.   [place] Current DPM 

## 2019-08-16 DIAGNOSIS — M25552 Pain in left hip: Secondary | ICD-10-CM | POA: Diagnosis not present

## 2019-10-11 ENCOUNTER — Ambulatory Visit: Payer: Medicare HMO | Admitting: Podiatry

## 2019-10-11 ENCOUNTER — Encounter: Payer: Self-pay | Admitting: Podiatry

## 2019-10-11 ENCOUNTER — Other Ambulatory Visit: Payer: Self-pay

## 2019-10-11 DIAGNOSIS — B351 Tinea unguium: Secondary | ICD-10-CM

## 2019-10-11 DIAGNOSIS — M79674 Pain in right toe(s): Secondary | ICD-10-CM

## 2019-10-11 NOTE — Progress Notes (Signed)
Patient ID: Steven [name], male   DOB: [birth year], 85 y.o.   MRN: [id number] Complaint:  Visit Type: Patient returns to my office for continued preventative foot care services. Complaint: Patient states" my nails have grown long and thick and become painful to walk and wear shoes" . The patient presents for preventative foot care services. No changes to ROS.    Podiatric Exam: Vascular: dorsalis pedis and posterior tibial pulses are palpable bilateral. Capillary return is immediate. Temperature gradient is WNL. Skin turgor WNL  Sensorium: Normal Semmes Weinstein monofilament test. Normal tactile sensation bilaterally. Nail Exam: Pt has thick disfigured discolored nails with subungual debris noted bilateral entire nail hallux through fifth toenails Ulcer Exam: There is no evidence of ulcer or pre-ulcerative changes or infection. Orthopedic Exam: Muscle tone and strength are WNL. No limitations in general ROM. No crepitus or effusions noted. Foot type and digits show no abnormalities. Bony prominences are sub 5th  B/L. Skin:  . No infection or ulcers.    Diagnosis:  Onychomycosis, , Pain in right toe, pain in left toe  Treatment & Plan Procedures and Treatment: Consent by patient was obtained for treatment procedures. The patient understood the discussion of treatment and procedures well. All questions were answered thoroughly reviewed. Debridement of mycotic and hypertrophic toenails, 1 through 5 bilateral and clearing of subungual debris. No ulceration, no infection noted. . Return Visit-Office Procedure: Patient instructed to return to the office for a follow up visit 10 weeks for continued evaluation and treatment.   Gardiner Barefoot DPM

## 2019-10-30 ENCOUNTER — Emergency Department (HOSPITAL_COMMUNITY): Payer: Medicare HMO

## 2019-10-30 ENCOUNTER — Encounter (HOSPITAL_COMMUNITY): Payer: Self-pay

## 2019-10-30 ENCOUNTER — Inpatient Hospital Stay (HOSPITAL_COMMUNITY)
Admission: EM | Admit: 2019-10-30 | Discharge: 2019-11-02 | DRG: 066 | Disposition: A | Discharge status: Skilled Nursing Facility | Payer: Medicare HMO | Attending: Internal Medicine | Admitting: Internal Medicine

## 2019-10-30 DIAGNOSIS — Z79899 Other long term (current) drug therapy: Secondary | ICD-10-CM

## 2019-10-30 DIAGNOSIS — M6281 Muscle weakness (generalized): Secondary | ICD-10-CM | POA: Diagnosis not present

## 2019-10-30 DIAGNOSIS — R29898 Other symptoms and signs involving the musculoskeletal system: Secondary | ICD-10-CM | POA: Diagnosis present

## 2019-10-30 DIAGNOSIS — R29702 NIHSS score 2: Secondary | ICD-10-CM | POA: Diagnosis present

## 2019-10-30 DIAGNOSIS — Z7983 Long term (current) use of bisphosphonates: Secondary | ICD-10-CM

## 2019-10-30 DIAGNOSIS — Z20828 Contact with and (suspected) exposure to other viral communicable diseases: Secondary | ICD-10-CM | POA: Diagnosis present

## 2019-10-30 DIAGNOSIS — Z7982 Long term (current) use of aspirin: Secondary | ICD-10-CM

## 2019-10-30 DIAGNOSIS — M170 Bilateral primary osteoarthritis of knee: Secondary | ICD-10-CM | POA: Diagnosis present

## 2019-10-30 DIAGNOSIS — I639 Cerebral infarction, unspecified: Principal | ICD-10-CM | POA: Diagnosis present

## 2019-10-30 DIAGNOSIS — R296 Repeated falls: Secondary | ICD-10-CM | POA: Diagnosis not present

## 2019-10-30 DIAGNOSIS — K219 Gastro-esophageal reflux disease without esophagitis: Secondary | ICD-10-CM | POA: Diagnosis present

## 2019-10-30 DIAGNOSIS — M810 Age-related osteoporosis without current pathological fracture: Secondary | ICD-10-CM | POA: Diagnosis present

## 2019-10-30 DIAGNOSIS — N400 Enlarged prostate without lower urinary tract symptoms: Secondary | ICD-10-CM | POA: Diagnosis present

## 2019-10-30 DIAGNOSIS — G8314 Monoplegia of lower limb affecting left nondominant side: Secondary | ICD-10-CM | POA: Diagnosis present

## 2019-10-30 DIAGNOSIS — Z8781 Personal history of (healed) traumatic fracture: Secondary | ICD-10-CM

## 2019-10-30 DIAGNOSIS — M5136 Other intervertebral disc degeneration, lumbar region: Secondary | ICD-10-CM | POA: Diagnosis present

## 2019-10-30 DIAGNOSIS — R5381 Other malaise: Secondary | ICD-10-CM | POA: Diagnosis not present

## 2019-10-30 DIAGNOSIS — E785 Hyperlipidemia, unspecified: Secondary | ICD-10-CM | POA: Diagnosis present

## 2019-10-30 DIAGNOSIS — Z803 Family history of malignant neoplasm of breast: Secondary | ICD-10-CM

## 2019-10-30 DIAGNOSIS — Z87891 Personal history of nicotine dependence: Secondary | ICD-10-CM

## 2019-10-30 DIAGNOSIS — R29818 Other symptoms and signs involving the nervous system: Secondary | ICD-10-CM | POA: Diagnosis not present

## 2019-10-30 DIAGNOSIS — M1710 Unilateral primary osteoarthritis, unspecified knee: Secondary | ICD-10-CM | POA: Diagnosis present

## 2019-10-30 DIAGNOSIS — Z833 Family history of diabetes mellitus: Secondary | ICD-10-CM

## 2019-10-30 DIAGNOSIS — R52 Pain, unspecified: Secondary | ICD-10-CM | POA: Diagnosis not present

## 2019-10-30 LAB — URINALYSIS, ROUTINE W REFLEX MICROSCOPIC
Bilirubin Urine: NEGATIVE
Glucose, UA: NEGATIVE mg/dL
Hgb urine dipstick: NEGATIVE
Ketones, ur: 20 mg/dL — AB
Leukocytes,Ua: NEGATIVE
Nitrite: NEGATIVE
Protein, ur: NEGATIVE mg/dL
Specific Gravity, Urine: 1.016 (ref 1.005–1.030)
pH: 6 (ref 5.0–8.0)

## 2019-10-30 LAB — CBC
HCT: 47.4 % (ref 39.0–52.0)
Hemoglobin: 15.5 g/dL (ref 13.0–17.0)
MCH: 29.7 pg (ref 26.0–34.0)
MCHC: 32.7 g/dL (ref 30.0–36.0)
MCV: 90.8 fL (ref 80.0–100.0)
Platelets: 223 10*3/uL (ref 150–400)
RBC: 5.22 MIL/uL (ref 4.22–5.81)
RDW: 13.5 % (ref 11.5–15.5)
WBC: 8.1 10*3/uL (ref 4.0–10.5)
nRBC: 0 % (ref 0.0–0.2)

## 2019-10-30 LAB — COMPREHENSIVE METABOLIC PANEL
ALT: 26 U/L (ref 0–44)
AST: 25 U/L (ref 15–41)
Albumin: 4.2 g/dL (ref 3.5–5.0)
Alkaline Phosphatase: 55 U/L (ref 38–126)
Anion gap: 10 (ref 5–15)
BUN: 13 mg/dL (ref 8–23)
CO2: 26 mmol/L (ref 22–32)
Calcium: 9 mg/dL (ref 8.9–10.3)
Chloride: 103 mmol/L (ref 98–111)
Creatinine, Ser: 0.54 mg/dL — ABNORMAL LOW (ref 0.61–1.24)
GFR calc Af Amer: >60 mL/min (ref >60)
GFR calc non Af Amer: >60 mL/min (ref >60)
Glucose, Bld: 107 mg/dL — ABNORMAL HIGH (ref 70–99)
Potassium: 3.7 mmol/L (ref 3.5–5.1)
Sodium: 139 mmol/L (ref 135–145)
Total Bilirubin: 0.8 mg/dL (ref 0.3–1.2)
Total Protein: 7.4 g/dL (ref 6.5–8.1)

## 2019-10-30 MED ORDER — STROKE: EARLY STAGES OF RECOVERY BOOK
Freq: Once | Status: DC
  Filled 2019-10-30: qty 1

## 2019-10-30 NOTE — ED Provider Notes (Signed)
[facility] DEPT Provider Note   CSN: [id number] Arrival date & time: 10/30/19  1129     History Chief Complaint  Patient presents with  . Knee Pain    Steven [name] is a 85 y.o. male.  HPI Patient presents with weakness in his legs.  States worse on the left side.  States that last night and again this morning his legs gave out on him.  States his left leg feels much weaker than normal.  States normally does not have weakness in it but does walk with a walker.  States he went down last night and again today.  No real back pain now but states he will sometimes have pain in his back.  No hip pain.  States he does have some arthritis in his right knee but is really not bothering him now.  No headache.  No confusion.  No upper extremity weakness.    Past Medical History:  Diagnosis Date  . Acid reflux   . BPH (benign prostatic hyperplasia)    neg bx. abn PSA  . Hyperlipemia   . Osteoporosis 09/2016   osteoporosis with vertebral fracture    Patient Active Problem List   Diagnosis Date Noted  . Chronic cough 05/23/2018  . NSIP (nonspecific interstitial pneumonitis) ([place]) 05/23/2018  . GERD (gastroesophageal reflux disease) 05/20/2018  . Wears hearing aid 05/20/2018  . Ectropion of both eyes 07/24/2015  . Nuclear cataract 06/03/2012    Past Surgical History:  Procedure Laterality Date  . CATARACT EXTRACTION, BILATERAL    . eyelid surgery  2017       Family History  Problem Relation Age of Onset  . Diabetes Mother   . Breast cancer Mother     Social History   Tobacco Use  . Smoking status: Former Smoker    Packs/day: 2.00    Years: 17.00    Pack years: 34.00    Types: Cigarettes, Pipe    Quit date: 1973    Years since quitting: 48.0  . Smokeless tobacco: Never Used  Substance Use Topics  . Alcohol use: No    Alcohol/week: 0.0 standard drinks  . Drug use: No    Home Medications Prior to Admission medications   Medication  Sig Start Date End Date Taking? Authorizing Provider  acetaminophen (TYLENOL) 650 MG CR tablet Take 650 mg by mouth every 8 (eight) hours as needed for pain.   Yes [provider]  alendronate (FOSAMAX) 70 MG tablet Take 70 mg by mouth once a week.  02/19/19  Yes [provider]  aspirin 81 MG tablet Take 81 mg by mouth every other day.    Yes [provider]  esomeprazole (NEXIUM) 40 MG capsule Take 30- 60 min before your first and last meals of the day Patient taking differently: Take 40 mg by mouth 2 (two) times daily before a meal.  08/16/18  Yes Tanda Rockers, MD  famotidine (PEPCID) 20 MG tablet Take 1 tablet (20 mg total) by mouth at bedtime. 08/16/18  Yes Tanda Rockers, MD  loratadine (CLARITIN) 10 MG tablet Take 10 mg by mouth daily.   Yes [provider]  Multiple Vitamins-Minerals (CENTRUM SILVER 50+MEN) TABS Take 1 tablet by mouth daily.    Yes [provider]  naproxen sodium (ALEVE) 220 MG tablet Take 220 mg by mouth 2 (two) times daily with a meal.    Yes [provider]  Omega-3 Fatty Acids (FISH OIL) 1200 MG  CAPS Take 1,200 mg by mouth 3 (three) times daily.   Yes [provider]  tamsulosin (FLOMAX) 0.4 MG CAPS capsule Take 0.4 mg by mouth daily.    Yes [provider]    Allergies    Penicillins  Review of Systems   Review of Systems  Constitutional: Negative for appetite change.  HENT: Negative for congestion.   Respiratory: Negative for chest tightness.   Cardiovascular: Negative for chest pain.  Gastrointestinal: Negative for abdominal distention and abdominal pain.  Genitourinary: Negative for flank pain.  Musculoskeletal: Positive for back pain.  Skin: Negative for wound.  Neurological: Positive for weakness.  Psychiatric/Behavioral: Negative for confusion.    Physical Exam Updated Vital Signs BP (!) 152/112 (BP Location: Left Arm)   Pulse 90   Temp 97.7 F (36.5 C) (Oral)   Resp 16    SpO2 98%   Physical Exam Vitals and nursing note reviewed.  HENT:     Head: Atraumatic.  Eyes:     Extraocular Movements: Extraocular movements intact.  Cardiovascular:     Rate and Rhythm: Regular rhythm.  Pulmonary:     Breath sounds: Normal breath sounds.  Abdominal:     Tenderness: There is no abdominal tenderness.  Musculoskeletal:        General: No tenderness or deformity.     Cervical back: Neck supple.     Right lower leg: No edema.     Left lower leg: No edema.     Comments: No cervical or lumbar tenderness.  No tenderness over hips bilaterally  Skin:    General: Skin is warm.  Neurological:     Mental Status: He is alert.     Comments: Face symmetric.  Good grip strength bilaterally.  Good flexion and extension in upper extremities.  Good straight leg raise on right side and good flexion extension at the knee on the right side.  However on the left side strength is not as strong as contralateral.  Decreased straight leg raising and somewhat decreased strength at the knee.  Decreased plantar and dorsiflexion at the left ankle.  Sensation grossly intact.     ED Results / Procedures / Treatments   Labs (all labs ordered are listed, but only abnormal results are displayed) Labs Reviewed  COMPREHENSIVE METABOLIC PANEL - Abnormal; Notable for the following components:      Result Value   Glucose, Bld 107 (*)    Creatinine, Ser 0.54 (*)    All other components within normal limits  URINALYSIS, ROUTINE W REFLEX MICROSCOPIC - Abnormal; Notable for the following components:   Ketones, ur 20 (*)    All other components within normal limits  SARS CORONAVIRUS 2 (TAT 6-24 HRS)  CBC    EKG EKG Interpretation  Date/Time:  Monday October 30 2019 20:46:38 EST Ventricular Rate:  84 PR Interval:  180 QRS Duration: 88 QT Interval:  360 QTC Calculation: 425 R Axis:   -30 Text Interpretation: Normal sinus rhythm Left axis deviation Abnormal ECG Confirmed by Davonna Belling [phone]) on 10/30/2019 10:18:04 PM   Radiology DG Lumbar Spine Complete  Result Date: 10/30/2019 CLINICAL DATA:  Falls EXAM: LUMBAR SPINE - COMPLETE 4+ VIEW COMPARISON:  2019 FINDINGS: Lumbar vertebral body heights and alignment are similar to the prior study including compression deformity of L4 and trace anterolisthesis at L4-L5. Multilevel degenerative disc disease is present with disc space narrowing and small endplate osteophytes. There is facet hypertrophy, greater at lower lumbar levels. IMPRESSION: No new  compression deformity. Similar appearance of multilevel degenerative changes. Electronically Signed   By: Macy Mis M.D.   On: 10/30/2019 21:28   CT Head Wo Contrast  Result Date: 10/30/2019 CLINICAL DATA:  Neuro deficit, unable to stand EXAM: CT HEAD WITHOUT CONTRAST TECHNIQUE: Contiguous axial images were obtained from the base of the skull through the vertex without intravenous contrast. COMPARISON:  None. FINDINGS: Brain: No evidence of acute territorial infarction, hemorrhage, hydrocephalus,extra-axial collection or mass lesion/mass effect. There is dilatation the ventricles and sulci consistent with age-related atrophy. Low-attenuation changes in the deep white matter consistent with small vessel ischemia. Vascular: No hyperdense vessel or unexpected calcification. Skull: The skull is intact. No fracture or focal lesion identified. Sinuses/Orbits: The visualized paranasal sinuses and mastoid air cells are clear. The orbits and globes intact. Other: None IMPRESSION: No acute intracranial abnormality. Findings consistent with age related atrophy and chronic small vessel ischemia Electronically Signed   By: Prudencio Pair M.D.   On: 10/30/2019 21:12    Procedures Procedures (including critical care time)  Medications Ordered in ED Medications - No data to display  ED Course  I have reviewed the triage vital signs and the nursing notes.  Pertinent labs & imaging results  that were available during my care of the patient were reviewed by me and considered in my medical decision making (see chart for details).    MDM Rules/Calculators/A&P                      Patient with weakness.  Worse in lower extremities particularly left.  Head CT and lumbar x-ray reassuring.  However with lateralizing weakness feels the patient would benefit from admission to the hospital.  Will discuss with hospitalist. Final Clinical Impression(s) / ED Diagnoses Final diagnoses:  Weakness of left lower extremity    Rx / DC Orders ED Discharge Orders    None       Davonna Belling, MD 10/30/19 2310

## 2019-10-30 NOTE — ED Triage Notes (Signed)
Per EMs- patient is from home. Patient reports a history of arthritis and is c/o bilateral knee pain. Patient states he is unable to stand since 8 PM last night.

## 2019-10-30 NOTE — H&P (Signed)
History and Physical    Steven [name] [id number] DOB: [birth year] DOA: 10/30/2019  PCP: Mayra Neer, MD   Patient coming from: Home.  I have personally briefly reviewed patient's old medical records in [place]  Chief Complaint: Bilateral knee pain.  HPI: Steven [name] is a 85 y.o. male with medical history significant of GERD, BPH, hyperlipidemia, osteoporosis, osteoarthritis of the knees with history of vertebral fracture who is coming to the emergency department due to bilateral knee pain and LLE weakness.  He has being unable to stand up since yesterday evening at 2100.  He denies headache, blurred vision, slurred speech, nausea or emesis, but states that his gait is unsteady.  He normally uses a walker at home.  He denies chest pain, dyspnea, palpitations, dizziness or diaphoresis.  No abdominal pain, diarrhea, constipation, melena or hematochezia.  No dysuria, frequency or hematuria.  ED Course: Initial vital signs temperature 99 F, pulse 96, respirations 16, blood pressure 153/106 mmHg and O2 sat 96% on room air.  His CBC was normal.  Urinalysis showed ketonuria 20 mg/dL, but was otherwise normal.  CMP shows a glucose of 107 and creatinine of 0.54 mg/dL.  All other values are within normal limits.  Lumbar spine radiographs shows previous findings, but no acute abnormalities.  CT head did not show any acute intracranial normality.  Please see images and full regular report for further detail.  Review of Systems: As per HPI otherwise 10 point review of systems negative.   Past Medical History:  Diagnosis Date  . Acid reflux   . BPH (benign prostatic hyperplasia)    neg bx. abn PSA  . Hyperlipemia   . Osteoporosis 09/2016   osteoporosis with vertebral fracture    Past Surgical History:  Procedure Laterality Date  . CATARACT EXTRACTION, BILATERAL    . eyelid surgery  2017     reports that he quit smoking about 48 years ago. His smoking use included cigarettes  and pipe. He has a 34.00 pack-year smoking history. He has never used smokeless tobacco. He reports that he does not drink alcohol or use drugs.  Allergies  Allergen Reactions  . Penicillins Anaphylaxis    Did it involve swelling of the face/tongue/throat, SOB, or low BP? Yes Did it involve sudden or severe rash/hives, skin peeling, or any reaction on the inside of your mouth or nose? No Did you need to seek medical attention at a hospital or doctor's office? Yes When did it last happen?>10y If all above answers are "NO", may proceed with cephalosporin use.     Family History  Problem Relation Age of Onset  . Diabetes Mother   . Breast cancer Mother    Prior to Admission medications   Medication Sig Start Date End Date Taking? Authorizing Provider  acetaminophen (TYLENOL) 650 MG CR tablet Take 650 mg by mouth every 8 (eight) hours as needed for pain.   Yes [provider]  alendronate (FOSAMAX) 70 MG tablet Take 70 mg by mouth once a week.  02/19/19  Yes [provider]  aspirin 81 MG tablet Take 81 mg by mouth every other day.    Yes [provider]  esomeprazole (NEXIUM) 40 MG capsule Take 30- 60 min before your first and last meals of the day Patient taking differently: Take 40 mg by mouth 2 (two) times daily before a meal.  08/16/18  Yes Tanda Rockers, MD  famotidine (PEPCID) 20 MG tablet Take 1 tablet (20 mg  total) by mouth at bedtime. 08/16/18  Yes Tanda Rockers, MD  loratadine (CLARITIN) 10 MG tablet Take 10 mg by mouth daily.   Yes [provider]  Multiple Vitamins-Minerals (CENTRUM SILVER 50+MEN) TABS Take 1 tablet by mouth daily.    Yes [provider]  naproxen sodium (ALEVE) 220 MG tablet Take 220 mg by mouth 2 (two) times daily with a meal.    Yes [provider]  Omega-3 Fatty Acids (FISH OIL) 1200 MG CAPS Take 1,200 mg by mouth 3 (three) times daily.   Yes [provider]  tamsulosin (FLOMAX) 0.4 MG  CAPS capsule Take 0.4 mg by mouth daily.    Yes [provider]    Physical Exam: Vitals:   10/30/19 1137 10/30/19 1906  BP: (!) 153/106 (!) 152/112  Pulse: 96 90  Resp: 16 16  Temp: 99 F (37.2 C) 97.7 F (36.5 C)  TempSrc: Oral Oral  SpO2: 96% 98%    Constitutional: NAD, calm, comfortable Eyes: PERRL, lids and conjunctivae normal ENMT: Significant hearing impairment.  Mucous membranes are moist. Posterior pharynx clear of any exudate or lesions. Neck: normal, supple, no masses, no thyromegaly Respiratory: Decreased breath sounds in bases, otherwise clear to auscultation bilaterally, no wheezing, no crackles. Normal respiratory effort. No accessory muscle use.  Cardiovascular: Regular rate and rhythm, no murmurs / rubs / gallops. No extremity edema. 2+ pedal pulses. No carotid bruits.  Abdomen: Nondistended.  Soft, no tenderness, no masses palpated. No hepatosplenomegaly. Bowel sounds positive.  Musculoskeletal: no clubbing / cyanosis.  Some decrease in lower extremities active ROM, no contractures. Normal muscle tone.  Skin: no gross rashes, lesions, ulcers on limited dermatological examination. Neurologic: CN 2-12 grossly intact. Sensation intact, DTR normal.  Generalized weakness, but pronounced weakness LLE when compared to RLE. Psychiatric: Alert and oriented x 3. Normal mood.   Labs on Admission: I have personally reviewed following labs and imaging studies  CBC: Recent Labs  Lab 10/30/19 2050  WBC 8.1  HGB 15.5  HCT 47.4  MCV 90.8  PLT [id number]   Basic Metabolic Panel: Recent Labs  Lab 10/30/19 2050  NA 139  K 3.7  CL 103  CO2 26  GLUCOSE 107*  BUN 13  CREATININE 0.54*  CALCIUM 9.0   GFR: CrCl cannot be calculated (Unknown ideal weight.). Liver Function Tests: Recent Labs  Lab 10/30/19 2050  AST 25  ALT 26  ALKPHOS 55  BILITOT 0.8  PROT 7.4  ALBUMIN 4.2   No results for input(s): LIPASE, AMYLASE in the last 168 hours. No results for  input(s): AMMONIA in the last 168 hours. Coagulation Profile: No results for input(s): INR, PROTIME in the last 168 hours. Cardiac Enzymes: No results for input(s): CKTOTAL, CKMB, CKMBINDEX, TROPONINI in the last 168 hours. BNP (last 3 results) No results for input(s): PROBNP in the last 8760 hours. HbA1C: No results for input(s): HGBA1C in the last 72 hours. CBG: No results for input(s): GLUCAP in the last 168 hours. Lipid Profile: No results for input(s): CHOL, HDL, LDLCALC, TRIG, CHOLHDL, LDLDIRECT in the last 72 hours. Thyroid Function Tests: No results for input(s): TSH, T4TOTAL, FREET4, T3FREE, THYROIDAB in the last 72 hours. Anemia Panel: No results for input(s): VITAMINB12, FOLATE, FERRITIN, TIBC, IRON, RETICCTPCT in the last 72 hours. Urine analysis:    Component Value Date/Time   COLORURINE YELLOW 10/30/2019 2049   APPEARANCEUR CLEAR 10/30/2019 2049   LABSPEC 1.016 10/30/2019 2049   PHURINE 6.0 10/30/2019 2049  GLUCOSEU NEGATIVE 10/30/2019 2049   [place] NEGATIVE 10/30/2019 2049   [place] 10/30/2019 2049   KETONESUR 20 (A) 10/30/2019 2049   PROTEINUR NEGATIVE 10/30/2019 2049   NITRITE NEGATIVE 10/30/2019 2049   LEUKOCYTESUR NEGATIVE 10/30/2019 2049    Radiological Exams on Admission: DG Lumbar Spine Complete  Result Date: 10/30/2019 CLINICAL DATA:  Falls EXAM: LUMBAR SPINE - COMPLETE 4+ VIEW COMPARISON:  2019 FINDINGS: Lumbar vertebral body heights and alignment are similar to the prior study including compression deformity of L4 and trace anterolisthesis at L4-L5. Multilevel degenerative disc disease is present with disc space narrowing and small endplate osteophytes. There is facet hypertrophy, greater at lower lumbar levels. IMPRESSION: No new compression deformity. Similar appearance of multilevel degenerative changes. Electronically Signed   By: Macy Mis M.D.   On: 10/30/2019 21:28   CT Head Wo Contrast  Result Date: 10/30/2019 CLINICAL DATA:   Neuro deficit, unable to stand EXAM: CT HEAD WITHOUT CONTRAST TECHNIQUE: Contiguous axial images were obtained from the base of the skull through the vertex without intravenous contrast. COMPARISON:  None. FINDINGS: Brain: No evidence of acute territorial infarction, hemorrhage, hydrocephalus,extra-axial collection or mass lesion/mass effect. There is dilatation the ventricles and sulci consistent with age-related atrophy. Low-attenuation changes in the deep white matter consistent with small vessel ischemia. Vascular: No hyperdense vessel or unexpected calcification. Skull: The skull is intact. No fracture or focal lesion identified. Sinuses/Orbits: The visualized paranasal sinuses and mastoid air cells are clear. The orbits and globes intact. Other: None IMPRESSION: No acute intracranial abnormality. Findings consistent with age related atrophy and chronic small vessel ischemia Electronically Signed   By: Prudencio Pair M.D.   On: 10/30/2019 21:12    EKG: Independently reviewed. Vent. rate 84 BPM PR interval 180 ms QRS duration 88 ms QT/QTc 360/425 ms P-R-T axes 62 -30 40 Normal sinus rhythm Left axis deviation Abnormal ECG  Assessment/Plan Principal Problem:   Weakness of left lower extremity Observation/telemetry. Frequent neuro checks. Check swallow screen. PT is/OT evaluation. Check fasting lipids and hemoglobin A1c. Check carotid Doppler. Check echocardiogram. Consider neurology evaluation.  Active Problems:   GERD (gastroesophageal reflux disease) Continue Protonix 40 mg p.o. twice daily. Continue famotidine 40 mg p.o. daily.    BPH (benign prostatic hyperplasia) Continue tamsulosin.    Hyperlipemia Continue omega-Friday assess/3 fish oils.    DVT prophylaxis: SCDs Code Status: Full code. Family Communication: Disposition Plan: TIA/LLE weakness work-up. Consults called: Admission status: Observation/telemetry.   Reubin Milan MD [facility]  If  7PM-7AM, please contact night-coverage [email or web address]  10/30/2019, 11:45 PM   This document was prepared using Dragon voice recognition software and may contain some unintended transcription errors.

## 2019-10-31 ENCOUNTER — Encounter (HOSPITAL_COMMUNITY): Payer: Self-pay | Admitting: Internal Medicine

## 2019-10-31 ENCOUNTER — Other Ambulatory Visit: Payer: Self-pay

## 2019-10-31 ENCOUNTER — Observation Stay (HOSPITAL_COMMUNITY): Payer: Medicare HMO

## 2019-10-31 ENCOUNTER — Observation Stay (HOSPITAL_BASED_OUTPATIENT_CLINIC_OR_DEPARTMENT_OTHER): Payer: Medicare HMO

## 2019-10-31 DIAGNOSIS — R2681 Unsteadiness on feet: Secondary | ICD-10-CM | POA: Diagnosis not present

## 2019-10-31 DIAGNOSIS — I639 Cerebral infarction, unspecified: Secondary | ICD-10-CM | POA: Diagnosis not present

## 2019-10-31 DIAGNOSIS — M2550 Pain in unspecified joint: Secondary | ICD-10-CM | POA: Diagnosis not present

## 2019-10-31 DIAGNOSIS — M810 Age-related osteoporosis without current pathological fracture: Secondary | ICD-10-CM | POA: Diagnosis not present

## 2019-10-31 DIAGNOSIS — E785 Hyperlipidemia, unspecified: Secondary | ICD-10-CM | POA: Diagnosis not present

## 2019-10-31 DIAGNOSIS — Z803 Family history of malignant neoplasm of breast: Secondary | ICD-10-CM | POA: Diagnosis not present

## 2019-10-31 DIAGNOSIS — Z79899 Other long term (current) drug therapy: Secondary | ICD-10-CM | POA: Diagnosis not present

## 2019-10-31 DIAGNOSIS — Z7982 Long term (current) use of aspirin: Secondary | ICD-10-CM | POA: Diagnosis not present

## 2019-10-31 DIAGNOSIS — R29898 Other symptoms and signs involving the musculoskeletal system: Secondary | ICD-10-CM | POA: Diagnosis present

## 2019-10-31 DIAGNOSIS — M5030 Other cervical disc degeneration, unspecified cervical region: Secondary | ICD-10-CM | POA: Diagnosis not present

## 2019-10-31 DIAGNOSIS — R531 Weakness: Secondary | ICD-10-CM

## 2019-10-31 DIAGNOSIS — R29702 NIHSS score 2: Secondary | ICD-10-CM | POA: Diagnosis not present

## 2019-10-31 DIAGNOSIS — Z87311 Personal history of (healed) other pathological fracture: Secondary | ICD-10-CM | POA: Diagnosis not present

## 2019-10-31 DIAGNOSIS — R5381 Other malaise: Secondary | ICD-10-CM | POA: Diagnosis not present

## 2019-10-31 DIAGNOSIS — Z20828 Contact with and (suspected) exposure to other viral communicable diseases: Secondary | ICD-10-CM | POA: Diagnosis not present

## 2019-10-31 DIAGNOSIS — Z87891 Personal history of nicotine dependence: Secondary | ICD-10-CM | POA: Diagnosis not present

## 2019-10-31 DIAGNOSIS — J3489 Other specified disorders of nose and nasal sinuses: Secondary | ICD-10-CM | POA: Diagnosis not present

## 2019-10-31 DIAGNOSIS — Z7983 Long term (current) use of bisphosphonates: Secondary | ICD-10-CM | POA: Diagnosis not present

## 2019-10-31 DIAGNOSIS — M1710 Unilateral primary osteoarthritis, unspecified knee: Secondary | ICD-10-CM | POA: Diagnosis present

## 2019-10-31 DIAGNOSIS — G8314 Monoplegia of lower limb affecting left nondominant side: Secondary | ICD-10-CM | POA: Diagnosis not present

## 2019-10-31 DIAGNOSIS — M25561 Pain in right knee: Secondary | ICD-10-CM | POA: Diagnosis not present

## 2019-10-31 DIAGNOSIS — M6281 Muscle weakness (generalized): Secondary | ICD-10-CM | POA: Diagnosis not present

## 2019-10-31 DIAGNOSIS — Z8781 Personal history of (healed) traumatic fracture: Secondary | ICD-10-CM | POA: Diagnosis not present

## 2019-10-31 DIAGNOSIS — I69844 Monoplegia of lower limb following other cerebrovascular disease affecting left non-dominant side: Secondary | ICD-10-CM | POA: Diagnosis not present

## 2019-10-31 DIAGNOSIS — R279 Unspecified lack of coordination: Secondary | ICD-10-CM | POA: Diagnosis not present

## 2019-10-31 DIAGNOSIS — I6782 Cerebral ischemia: Secondary | ICD-10-CM | POA: Diagnosis not present

## 2019-10-31 DIAGNOSIS — I6389 Other cerebral infarction: Secondary | ICD-10-CM | POA: Diagnosis not present

## 2019-10-31 DIAGNOSIS — K219 Gastro-esophageal reflux disease without esophagitis: Secondary | ICD-10-CM | POA: Diagnosis not present

## 2019-10-31 DIAGNOSIS — I5030 Unspecified diastolic (congestive) heart failure: Secondary | ICD-10-CM

## 2019-10-31 DIAGNOSIS — M5136 Other intervertebral disc degeneration, lumbar region: Secondary | ICD-10-CM | POA: Diagnosis not present

## 2019-10-31 DIAGNOSIS — M170 Bilateral primary osteoarthritis of knee: Secondary | ICD-10-CM | POA: Diagnosis not present

## 2019-10-31 DIAGNOSIS — Z7401 Bed confinement status: Secondary | ICD-10-CM | POA: Diagnosis not present

## 2019-10-31 DIAGNOSIS — R2689 Other abnormalities of gait and mobility: Secondary | ICD-10-CM | POA: Diagnosis not present

## 2019-10-31 DIAGNOSIS — Z833 Family history of diabetes mellitus: Secondary | ICD-10-CM | POA: Diagnosis not present

## 2019-10-31 DIAGNOSIS — N400 Enlarged prostate without lower urinary tract symptoms: Secondary | ICD-10-CM | POA: Diagnosis not present

## 2019-10-31 DIAGNOSIS — Z9181 History of falling: Secondary | ICD-10-CM | POA: Diagnosis not present

## 2019-10-31 LAB — LIPID PANEL
Cholesterol: 221 mg/dL — ABNORMAL HIGH (ref 0–200)
HDL: 47 mg/dL (ref >40)
LDL Cholesterol: 157 mg/dL — ABNORMAL HIGH (ref 0–99)
Total CHOL/HDL Ratio: 4.7 RATIO
Triglycerides: 83 mg/dL (ref <150)
VLDL: 17 mg/dL (ref 0–40)

## 2019-10-31 LAB — SARS CORONAVIRUS 2 (TAT 6-24 HRS): SARS Coronavirus 2: NEGATIVE

## 2019-10-31 LAB — HEMOGLOBIN A1C
Hgb A1c MFr Bld: 5.9 % — ABNORMAL HIGH (ref 4.8–5.6)
Mean Plasma Glucose: 122.63 mg/dL

## 2019-10-31 LAB — ECHOCARDIOGRAM COMPLETE

## 2019-10-31 MED ORDER — ASPIRIN EC 81 MG PO TBEC
81.0000 mg | DELAYED_RELEASE_TABLET | ORAL | Status: DC
  Administered 2019-10-31 – 2019-11-02 (×2): 81 mg via ORAL
  Filled 2019-10-31 (×3): qty 1

## 2019-10-31 MED ORDER — TAMSULOSIN HCL 0.4 MG PO CAPS
0.4000 mg | ORAL_CAPSULE | Freq: Every day | ORAL | Status: DC
  Administered 2019-10-31 – 2019-11-02 (×3): 0.4 mg via ORAL
  Filled 2019-10-31 (×3): qty 1

## 2019-10-31 MED ORDER — ACETAMINOPHEN 325 MG PO TABS
650.0000 mg | ORAL_TABLET | Freq: Three times a day (TID) | ORAL | Status: DC | PRN
  Administered 2019-11-02: 650 mg via ORAL
  Filled 2019-10-31 (×2): qty 2

## 2019-10-31 MED ORDER — OMEGA-3-ACID ETHYL ESTERS 1 G PO CAPS
1.0000 g | ORAL_CAPSULE | Freq: Two times a day (BID) | ORAL | Status: DC
  Administered 2019-10-31 – 2019-11-02 (×5): 1 g via ORAL
  Filled 2019-10-31 (×6): qty 1

## 2019-10-31 MED ORDER — LORATADINE 10 MG PO TABS
10.0000 mg | ORAL_TABLET | Freq: Every day | ORAL | Status: DC
  Administered 2019-10-31 – 2019-11-02 (×3): 10 mg via ORAL
  Filled 2019-10-31 (×3): qty 1

## 2019-10-31 MED ORDER — FAMOTIDINE 20 MG PO TABS
20.0000 mg | ORAL_TABLET | Freq: Every day | ORAL | Status: DC
  Administered 2019-10-31 – 2019-11-01 (×3): 20 mg via ORAL
  Filled 2019-10-31 (×3): qty 1

## 2019-10-31 MED ORDER — HEPARIN SODIUM (PORCINE) 5000 UNIT/ML IJ SOLN
5000.0000 [IU] | Freq: Three times a day (TID) | INTRAMUSCULAR | Status: DC
  Administered 2019-10-31 – 2019-11-01 (×4): 5000 [IU] via SUBCUTANEOUS
  Filled 2019-10-31 (×7): qty 1

## 2019-10-31 MED ORDER — PANTOPRAZOLE SODIUM 40 MG PO TBEC
40.0000 mg | DELAYED_RELEASE_TABLET | Freq: Two times a day (BID) | ORAL | Status: DC
  Administered 2019-10-31 – 2019-11-02 (×6): 40 mg via ORAL
  Filled 2019-10-31 (×6): qty 1

## 2019-10-31 NOTE — Progress Notes (Addendum)
Progress Note    Steven [name]  [id number] DOB: January 20, 1931  DOA: 10/30/2019 PCP: Mayra Neer, MD         Assessment/Plan:   Principal Problem:   Arthritis of knee, bilateral Active Problems:   GERD (gastroesophageal reflux disease)   Weakness of left lower extremity   BPH (benign prostatic hyperplasia)   Hyperlipemia   There is no height or weight on file to calculate BMI.   Bilateral knee arthritis and inability to stand weakness of bilateral lower extremities: Consult PT and OT.  Patient said he lives alone at home.  Follow-up with social worker to assist with disposition.  The echo showed grade 1 diastolic dysfunction.  EF estimated at 60 to 65%.  Carotid duplex is pending.  CT head was negative.  Multilevel lumbar degenerative disc disease: Analgesics as needed for pain.  PT and OT.  BPH: Continue Flomax  GERD: Continue Protonix   Family Communication/Anticipated D/C date and plan/Code Status   DVT prophylaxis: Heparin Code Status: Full code Family Communication: Discussed with the patient Disposition Plan: Possible discharge to home with home health versus SNF      Subjective:   He complains of inability to stand because of pain and mostly weakness in the knees.  He said he has arthritis in his knees.  Objective:    Vitals:   10/30/19 1906 10/31/19 0129 10/31/19 0614 10/31/19 1352  BP: (!) 152/112 120/77 (!) 139/98 112/76  Pulse: 90 94 84 (!) 105  Resp: 16 18 19  (!) 22  Temp: 97.7 F (36.5 C)  98 F (36.7 C) 97.9 F (36.6 C)  TempSrc: Oral  Oral Oral  SpO2: 98% 98% 94% 100%   No intake or output data in the 24 hours ending 10/31/19 1454 There were no vitals filed for this visit.  Exam:  GEN: NAD SKIN: No rash EYES: EOMI ENT: MMM, hard of hearing CV: RRR PULM: CTA B ABD: soft, ND, NT, +BS CNS: AAO x 3, non focal.  He moves all extremities without any problems. EXT: No edema or tenderness.  He has no tenderness in both  knee joints (left greater than right) but no swelling or erythema in the knee joints   Data Reviewed:   I have personally reviewed following labs and imaging studies:  Labs: Labs show the following:   Basic Metabolic Panel: Recent Labs  Lab 10/30/19 2050  NA 139  K 3.7  CL 103  CO2 26  GLUCOSE 107*  BUN 13  CREATININE 0.54*  CALCIUM 9.0   GFR CrCl cannot be calculated (Unknown ideal weight.). Liver Function Tests: Recent Labs  Lab 10/30/19 2050  AST 25  ALT 26  ALKPHOS 55  BILITOT 0.8  PROT 7.4  ALBUMIN 4.2   No results for input(s): LIPASE, AMYLASE in the last 168 hours. No results for input(s): AMMONIA in the last 168 hours. Coagulation profile No results for input(s): INR, PROTIME in the last 168 hours.  CBC: Recent Labs  Lab 10/30/19 2050  WBC 8.1  HGB 15.5  HCT 47.4  MCV 90.8  PLT 223   Cardiac Enzymes: No results for input(s): CKTOTAL, CKMB, CKMBINDEX, TROPONINI in the last 168 hours. BNP (last 3 results) No results for input(s): PROBNP in the last 8760 hours. CBG: No results for input(s): GLUCAP in the last 168 hours. D-Dimer: No results for input(s): DDIMER in the last 72 hours. Hgb A1c: Recent Labs    10/31/19 0336  HGBA1C 5.9*  Lipid Profile: Recent Labs    10/31/19 0336  CHOL 221*  HDL 47  LDLCALC 157*  TRIG 83  CHOLHDL 4.7   Thyroid function studies: No results for input(s): TSH, T4TOTAL, T3FREE, THYROIDAB in the last 72 hours.  Invalid input(s): FREET3 Anemia work up: No results for input(s): VITAMINB12, FOLATE, FERRITIN, TIBC, IRON, RETICCTPCT in the last 72 hours. Sepsis Labs: Recent Labs  Lab 10/30/19 2050  WBC 8.1    Microbiology Recent Results (from the past 240 hour(s))  SARS CORONAVIRUS 2 (TAT 6-24 HRS) Nasopharyngeal Nasopharyngeal Swab     Status: None   Collection Time: 10/30/19 10:13 PM   Specimen: Nasopharyngeal Swab  Result Value Ref Range Status   SARS Coronavirus 2 NEGATIVE NEGATIVE Final     Comment: (NOTE) SARS-CoV-2 target nucleic acids are NOT DETECTED. The SARS-CoV-2 RNA is generally detectable in upper and lower respiratory specimens during the acute phase of infection. Negative results do not preclude SARS-CoV-2 infection, do not rule out co-infections with other pathogens, and should not be used as the sole basis for treatment or other patient management decisions. Negative results must be combined with clinical observations, patient history, and epidemiological information. The expected result is Negative. Fact Sheet for Patients: SugarRoll.be Fact Sheet for Healthcare Providers: https://www.woods-mathews.com/ This test is not yet approved or cleared by the [place] FDA and  has been authorized for detection and/or diagnosis of SARS-CoV-2 by FDA under an Emergency Use Authorization (EUA). This EUA will remain  in effect (meaning this test can be used) for the duration of the COVID-19 declaration under Section 56 4(b)(1) of the Act, 21 U.S.C. section 360bbb-3(b)(1), unless the authorization is terminated or revoked sooner. Performed at [facility], [place] [address] [place], [place] [address]     Procedures and diagnostic studies:  DG Lumbar Spine Complete  Result Date: 10/30/2019 CLINICAL DATA:  [place] EXAM: LUMBAR SPINE - COMPLETE 4+ VIEW COMPARISON:  2019 FINDINGS: Lumbar vertebral body heights and alignment are similar to the prior study including compression deformity of L4 and trace anterolisthesis at L4-L5. Multilevel degenerative disc disease is present with disc space narrowing and small endplate osteophytes. There is facet hypertrophy, greater at lower lumbar levels. IMPRESSION: No new compression deformity. Similar appearance of multilevel degenerative changes. Electronically Signed   By: Macy Mis M.D.   On: 10/30/2019 21:28   CT Head Wo Contrast  Result Date: 10/30/2019 CLINICAL DATA:  Neuro  deficit, unable to stand EXAM: CT HEAD WITHOUT CONTRAST TECHNIQUE: Contiguous axial images were obtained from the base of the skull through the vertex without intravenous contrast. COMPARISON:  None. FINDINGS: Brain: No evidence of acute territorial infarction, hemorrhage, hydrocephalus,extra-axial collection or mass lesion/mass effect. There is dilatation the ventricles and sulci consistent with age-related atrophy. Low-attenuation changes in the deep white matter consistent with small vessel ischemia. Vascular: No hyperdense vessel or unexpected calcification. Skull: The skull is intact. No fracture or focal lesion identified. Sinuses/Orbits: The visualized paranasal sinuses and mastoid air cells are clear. The orbits and globes intact. Other: None IMPRESSION: No acute intracranial abnormality. Findings consistent with age related atrophy and chronic small vessel ischemia Electronically Signed   By: Prudencio Pair M.D.   On: 10/30/2019 21:12   ECHOCARDIOGRAM COMPLETE  Result Date: 10/31/2019   ECHOCARDIOGRAM REPORT   Patient Name:   LEMARR POCH Date of Exam: 10/31/2019 Medical Rec #:  [id number]      Height:       68.0 in Accession #:  [id number]     Weight:       170.0 lb Date of Birth:  1930-11-14      BSA:          1.91 m Patient Age:    43 years       BP:           139/98 mmHg Patient Gender: M              HR:           82 bpm. Exam Location:  Inpatient Procedure: 2D Echo, Cardiac Doppler and Color Doppler Indications:    TIA 435.9  History:        Patient has no prior history of Echocardiogram examinations.                 Risk Factors:Dyslipidemia and Former Smoker. GERD.  Sonographer:    Paulita Fujita RDCS Referring Phys: [id number] DAVID MANUEL [place]  1. Left ventricular ejection fraction, by visual estimation, is 60 to 65%. The left ventricle has normal function. There is no left ventricular hypertrophy.  2. Left ventricular diastolic parameters are consistent with Grade I diastolic  dysfunction (impaired relaxation).  3. The left ventricle has no regional wall motion abnormalities.  4. Global right ventricle has normal systolic function.The right ventricular size is normal. No increase in right ventricular wall thickness.  5. Left atrial size was normal.  6. Right atrial size was normal.  7. The mitral valve is normal in structure. Trivial mitral valve regurgitation. No evidence of mitral stenosis.  8. The tricuspid valve is normal in structure. Tricuspid valve regurgitation is not demonstrated.  9. The aortic valve is normal in structure. Aortic valve regurgitation is not visualized. No evidence of aortic valve sclerosis or stenosis. 10. The pulmonic valve was normal in structure. Pulmonic valve regurgitation is not visualized. 11. Normal pulmonary artery systolic pressure. 12. The inferior vena cava is normal in size with greater than 50% respiratory variability, suggesting right atrial pressure of 3 mmHg. FINDINGS  Left Ventricle: Left ventricular ejection fraction, by visual estimation, is 60 to 65%. The left ventricle has normal function. The left ventricle has no regional wall motion abnormalities. There is no left ventricular hypertrophy. Left ventricular diastolic parameters are consistent with Grade I diastolic dysfunction (impaired relaxation). Indeterminate filling pressures. Right Ventricle: The right ventricular size is normal. No increase in right ventricular wall thickness. Global RV systolic function is has normal systolic function. The tricuspid regurgitant velocity is 2.14 m/s, and with an assumed right atrial pressure  of 3 mmHg, the estimated right ventricular systolic pressure is normal at 21.3 mmHg. Left Atrium: Left atrial size was normal in size. Right Atrium: Right atrial size was normal in size Pericardium: There is no evidence of pericardial effusion. Mitral Valve: The mitral valve is normal in structure. Trivial mitral valve regurgitation. No evidence of mitral valve  stenosis by observation. Tricuspid Valve: The tricuspid valve is normal in structure. Tricuspid valve regurgitation is not demonstrated. Aortic Valve: The aortic valve is normal in structure. Aortic valve regurgitation is not visualized. The aortic valve is structurally normal, with no evidence of sclerosis or stenosis. Pulmonic Valve: The pulmonic valve was normal in structure. Pulmonic valve regurgitation is not visualized. Pulmonic regurgitation is not visualized. Aorta: The aortic root, ascending aorta and aortic arch are all structurally normal, with no evidence of dilitation or obstruction. Venous: The inferior vena cava is normal in size with greater than 50% respiratory variability, suggesting  right atrial pressure of 3 mmHg. IAS/Shunts: No atrial level shunt detected by color flow Doppler. There is no evidence of a patent foramen ovale. No ventricular septal defect is seen or detected. There is no evidence of an atrial septal defect.  LEFT VENTRICLE PLAX 2D LVIDd:         4.50 cm  Diastology LVIDs:         3.50 cm  LV e' lateral:   6.31 cm/s LV PW:         0.70 cm  LV E/e' lateral: 10.3 LV IVS:        0.70 cm  LV e' medial:    4.57 cm/s LVOT diam:     2.00 cm  LV E/e' medial:  14.2 LV SV:         42 ml LV SV Index:   21.48 LVOT Area:     3.14 cm  RIGHT VENTRICLE RV S prime:     10.10 cm/s TAPSE (M-mode): 1.9 cm LEFT ATRIUM             Index       RIGHT ATRIUM           Index LA diam:        2.20 cm 1.15 cm/m  RA Area:     11.10 cm LA Vol (A2C):   28.8 ml 15.10 ml/m RA Volume:   19.40 ml  10.17 ml/m LA Vol (A4C):   26.4 ml 13.84 ml/m LA Biplane Vol: 29.1 ml 15.26 ml/m  AORTIC VALVE LVOT Vmax:   93.30 cm/s LVOT Vmean:  64.900 cm/s LVOT VTI:    0.200 m  AORTA Ao Root diam: 3.70 cm MITRAL VALVE                        TRICUSPID VALVE MV Area (PHT): 2.46 cm             TR Peak grad:   18.3 mmHg MV PHT:        89.61 msec           TR Vmax:        214.00 cm/s MV Decel Time: 309 msec MV E velocity: 65.10  cm/s 103 cm/s  SHUNTS MV A velocity: 96.80 cm/s 70.3 cm/s Systemic VTI:  0.20 m MV E/A ratio:  0.67       1.5       Systemic Diam: 2.00 cm  Dani Gobble Croitoru MD Electronically signed by Sanda Klein MD Signature Date/Time: 10/31/2019/1:52:19 PM    Final    VAS US CAROTID (at [facility] only)  Result Date: 10/31/2019 Carotid Arterial Duplex Study Indications:       Weakness. Limitations        Today's exam was limited due to the patient's respiratory                    variation. Comparison Study:  No prior studies. Performing Technologist: Oliver Hum RVT  Examination Guidelines: A complete evaluation includes B-mode imaging, spectral Doppler, color Doppler, and power Doppler as needed of all accessible portions of each vessel. Bilateral testing is considered an integral part of a complete examination. Limited examinations for reoccurring indications may be performed as noted.  Right Carotid Findings: +----------+--------+--------+--------+-----------------------+--------+           PSV cm/sEDV cm/sStenosisPlaque Description     Comments +----------+--------+--------+--------+-----------------------+--------+ CCA Prox  77      18  smooth and heterogenoustortuous +----------+--------+--------+--------+-----------------------+--------+ CCA Distal34      7               smooth and heterogenous         +----------+--------+--------+--------+-----------------------+--------+ ICA Prox  41      14              smooth and heterogenoustortuous +----------+--------+--------+--------+-----------------------+--------+ ICA Distal62      22                                     tortuous +----------+--------+--------+--------+-----------------------+--------+ ECA       46      10                                              +----------+--------+--------+--------+-----------------------+--------+ +----------+--------+-------+--------+-------------------+           PSV  cm/sEDV cmsDescribeArm Pressure (mmHG) +----------+--------+-------+--------+-------------------+ [id number]                                         +----------+--------+-------+--------+-------------------+ +---------+--------+--+--------+--+---------+ VertebralPSV cm/s45EDV cm/s15Antegrade +---------+--------+--+--------+--+---------+  Left Carotid Findings: +----------+--------+--------+--------+-----------------------+--------+           PSV cm/sEDV cm/sStenosisPlaque Description     Comments +----------+--------+--------+--------+-----------------------+--------+ CCA Prox  76      17              smooth and heterogenoustortuous +----------+--------+--------+--------+-----------------------+--------+ CCA Distal45      11              smooth and heterogenous         +----------+--------+--------+--------+-----------------------+--------+ ICA Prox  30      6               smooth and heterogenoustortuous +----------+--------+--------+--------+-----------------------+--------+ ICA Distal54      21                                     tortuous +----------+--------+--------+--------+-----------------------+--------+ ECA       25      5                                               +----------+--------+--------+--------+-----------------------+--------+ +----------+--------+--------+--------+-------------------+           PSV cm/sEDV cm/sDescribeArm Pressure (mmHG) +----------+--------+--------+--------+-------------------+ Subclavian132                                         +----------+--------+--------+--------+-------------------+ +---------+--------+--+--------+-+---------+ VertebralPSV cm/s29EDV cm/s9Antegrade +---------+--------+--+--------+-+---------+  Summary: Right Carotid: Velocities in the right ICA are consistent with a 1-39% stenosis. Left Carotid: Velocities in the left ICA are consistent with a 1-39% stenosis. Vertebrals:  Bilateral vertebral arteries demonstrate antegrade flow. *See table(s) above for measurements and observations.     Preliminary     Medications:   .  stroke: mapping our early stages of recovery book   Does not apply Once  . aspirin EC  81 mg Oral QODAY  .  famotidine  20 mg Oral QHS  . heparin injection (subcutaneous)  5,000 Units Subcutaneous Q8H  . loratadine  10 mg Oral Daily  . omega-3 acid ethyl esters  1 g Oral BID  . pantoprazole  40 mg Oral BID  . tamsulosin  0.4 mg Oral Daily   Continuous Infusions:   LOS: 0 days   Harley Fitzwater  [facility]   *Please refer to [email or web address], password [email or web address] to get updated schedule on who will round on this patient, as hospitalists switch teams weekly. If 7PM-7AM, please contact night-coverage at [email or web address], password [email or web address] for any overnight needs.  10/31/2019, 2:54 PM

## 2019-10-31 NOTE — Progress Notes (Signed)
Carotid artery duplex has been completed. Preliminary results can be found in CV Proc through chart review.   10/31/19 10:35 AM Steven [name] RVT

## 2019-10-31 NOTE — ED Notes (Addendum)
Patient ambulatory to bed with two person assistance. Patient has weakness on left leg due to left knee pain.

## 2019-10-31 NOTE — ED Notes (Signed)
Placed patient belongings in locker #27, 1 pair of jeans, wallet, hearing aids, home medications. Patient kept his cell phone at the bedside

## 2019-10-31 NOTE — Progress Notes (Signed)
ED TO INPATIENT HANDOFF REPORT  ED Nurse Name and Phone #: Kimm Sider [phone]   Name/Age/Gender Steven [name] 85 y.o. male Room/Bed: WA27/WA27  Code Status   Code Status: Full Code  Home/SNF/Other Home Patient oriented to: self, place, time and situation Is this baseline? Yes   Triage Complete: Triage complete  Chief Complaint Weakness of left lower extremity [R29.898] Lower extremity weakness [R29.898]  Triage Note Per EMs- patient is from home. Patient reports a history of arthritis and is c/o bilateral knee pain. Patient states he is unable to stand since 8 PM last [name].    Allergies Allergies  Allergen Reactions  . Penicillins Anaphylaxis    Did it involve swelling of the face/tongue/throat, SOB, or low BP? Yes Did it involve sudden or severe rash/hives, skin peeling, or any reaction on the inside of your mouth or nose? No Did you need to seek medical attention at a hospital or doctor's office? Yes When did it last happen?>10y If all above answers are "NO", may proceed with cephalosporin use.     Level of Care/Admitting Diagnosis ED Disposition    ED Disposition Condition Comment   Admit  Hospital Area: [facility] [[id number]]  Level of Care: Med-Surg [16]  Covid Evaluation: Confirmed COVID Negative  Diagnosis: Lower extremity weakness [id number]  Admitting Physician: Rayburn Go  Attending Physician: Rayburn Go  Estimated length of stay: past midnight tomorrow  Certification:: I certify this patient will need inpatient services for at least 2 midnights       B Medical/Surgery History Past Medical History:  Diagnosis Date  . Acid reflux   . BPH (benign prostatic hyperplasia)    neg bx. abn PSA  . Hyperlipemia   . Osteoporosis 09/2016   osteoporosis with vertebral fracture   Past Surgical History:  Procedure Laterality Date  . CATARACT EXTRACTION, BILATERAL    . eyelid surgery  2017     A IV  Location/Drains/Wounds Patient Lines/Drains/Airways Status   Active Line/Drains/Airways    Name:   Placement date:   Placement time:   Site:   Days:   Peripheral IV 10/31/19 Right Forearm   10/31/19    0627    Forearm   less than 1          Intake/Output Last 24 hours No intake or output data in the 24 hours ending 10/31/19 2054  Labs/Imaging Results for orders placed or performed during the hospital encounter of 10/30/19 (from the past 48 hour(s))  Urinalysis, Routine w reflex microscopic     Status: Abnormal   Collection Time: 10/30/19  8:49 PM  Result Value Ref Range   Color, Urine YELLOW YELLOW   APPearance CLEAR CLEAR   Specific Gravity, Urine 1.016 1.005 - 1.030   pH 6.0 5.0 - 8.0   Glucose, UA NEGATIVE NEGATIVE mg/dL   Hgb urine dipstick NEGATIVE NEGATIVE   Bilirubin Urine NEGATIVE NEGATIVE   Ketones, ur 20 (A) NEGATIVE mg/dL   Protein, ur NEGATIVE NEGATIVE mg/dL   Nitrite NEGATIVE NEGATIVE   Leukocytes,Ua NEGATIVE NEGATIVE    Comment: Performed at [facility], [place] [address] [place], [place] [address]  Comprehensive metabolic panel     Status: Abnormal   Collection Time: 10/30/19  8:50 PM  Result Value Ref Range   Sodium 139 135 - 145 mmol/L   Potassium 3.7 3.5 - 5.1 mmol/L   Chloride 103 98 - 111 mmol/L   CO2 26 22 - 32 mmol/L  Glucose, Bld 107 (H) 70 - 99 mg/dL   BUN 13 8 - 23 mg/dL   Creatinine, Ser 0.54 (L) 0.61 - 1.24 mg/dL   Calcium 9.0 8.9 - 10.3 mg/dL   Total Protein 7.4 6.5 - 8.1 g/dL   Albumin 4.2 3.5 - 5.0 g/dL   AST 25 15 - 41 U/L   ALT 26 0 - 44 U/L   Alkaline Phosphatase 55 38 - 126 U/L   Total Bilirubin 0.8 0.3 - 1.2 mg/dL   GFR calc non Af Amer >60 >60 mL/min   GFR calc Af Amer >60 >60 mL/min   Anion gap 10 5 - 15    Comment: Performed at [facility], [place] [address], [place], [place] [address]  CBC     Status: None   Collection Time: 10/30/19  8:50 PM  Result Value Ref Range   WBC 8.1 4.0 - 10.5  K/uL   RBC 5.22 4.22 - 5.81 MIL/uL   Hemoglobin 15.5 13.0 - 17.0 g/dL   HCT 47.4 39.0 - 52.0 %   MCV 90.8 80.0 - 100.0 fL   MCH 29.7 26.0 - 34.0 pg   MCHC 32.7 30.0 - 36.0 g/dL   RDW 13.5 11.5 - 15.5 %   Platelets 223 150 - 400 K/uL   nRBC 0.0 0.0 - 0.2 %    Comment: Performed at [facility], [place] [address], [place], [place] [address]  SARS CORONAVIRUS 2 (TAT 6-24 HRS) Nasopharyngeal Nasopharyngeal Swab     Status: None   Collection Time: 10/30/19 10:13 PM   Specimen: Nasopharyngeal Swab  Result Value Ref Range   SARS Coronavirus 2 NEGATIVE NEGATIVE    Comment: (NOTE) SARS-CoV-2 target nucleic acids are NOT DETECTED. The SARS-CoV-2 RNA is generally detectable in upper and lower respiratory specimens during the acute phase of infection. Negative results do not preclude SARS-CoV-2 infection, do not rule out co-infections with other pathogens, and should not be used as the sole basis for treatment or other patient management decisions. Negative results must be combined with clinical observations, patient history, and epidemiological information. The expected result is Negative. Fact Sheet for Patients: SugarRoll.be Fact Sheet for Healthcare Providers: https://www.woods-mathews.com/ This test is not yet approved or cleared by the [place] FDA and  has been authorized for detection and/or diagnosis of SARS-CoV-2 by FDA under an Emergency Use Authorization (EUA). This EUA will remain  in effect (meaning this test can be used) for the duration of the COVID-19 declaration under Section 56 4(b)(1) of the Act, 21 U.S.C. section 360bbb-3(b)(1), unless the authorization is terminated or revoked sooner. Performed at [facility], [place] [address], [place], [place] [address]   Hemoglobin A1c     Status: Abnormal   Collection Time: 10/31/19  3:36 AM  Result Value Ref Range   Hgb A1c MFr Bld 5.9 (H) 4.8 - 5.6 %     Comment: (NOTE) Pre diabetes:          5.7%-6.4% Diabetes:              >6.4% Glycemic control for   <7.0% adults with diabetes    Mean Plasma Glucose 122.63 mg/dL    Comment: Performed at [facility] [address] [place], [place] [address]  Lipid panel     Status: Abnormal   Collection Time: 10/31/19  3:36 AM  Result Value Ref Range   Cholesterol 221 (H) 0 - 200 mg/dL   Triglycerides 83 <150  mg/dL   HDL 47 >40 mg/dL   Total CHOL/HDL Ratio 4.7 RATIO   VLDL 17 0 - 40 mg/dL   LDL Cholesterol 157 (H) 0 - 99 mg/dL    Comment:        Total Cholesterol/HDL:CHD Risk Coronary Heart Disease Risk Table                     Men   Women  1/2 Average Risk   3.4   3.3  Average Risk       5.0   4.4  2 X Average Risk   9.6   7.1  3 X Average Risk  23.4   11.0        Use the calculated Patient Ratio above and the CHD Risk Table to determine the patient's CHD Risk.        ATP III CLASSIFICATION (LDL):  <100     mg/dL   Optimal  100-129  mg/dL   Near or Above                    Optimal  130-159  mg/dL   Borderline  160-189  mg/dL   High  >190     mg/dL   Very High Performed at [facility] [address] [place], [place] [address]    DG Lumbar Spine Complete  Result Date: 10/30/2019 CLINICAL DATA:  [place] EXAM: LUMBAR SPINE - COMPLETE 4+ VIEW COMPARISON:  2019 FINDINGS: Lumbar vertebral body heights and alignment are similar to the prior study including compression deformity of L4 and trace anterolisthesis at L4-L5. Multilevel degenerative disc disease is present with disc space narrowing and small endplate osteophytes. There is facet hypertrophy, greater at lower lumbar levels. IMPRESSION: No new compression deformity. Similar appearance of multilevel degenerative changes. Electronically Signed   By: Macy Mis M.D.   On: 10/30/2019 21:28   CT Head Wo Contrast  Result Date: 10/30/2019 CLINICAL DATA:  Neuro deficit, unable to stand EXAM: CT HEAD WITHOUT  CONTRAST TECHNIQUE: Contiguous axial images were obtained from the base of the skull through the vertex without intravenous contrast. COMPARISON:  None. FINDINGS: Brain: No evidence of acute territorial infarction, hemorrhage, hydrocephalus,extra-axial collection or mass lesion/mass effect. There is dilatation the ventricles and sulci consistent with age-related atrophy. Low-attenuation changes in the deep white matter consistent with small vessel ischemia. Vascular: No hyperdense vessel or unexpected calcification. Skull: The skull is intact. No fracture or focal lesion identified. Sinuses/Orbits: The visualized paranasal sinuses and mastoid air cells are clear. The orbits and globes intact. Other: None IMPRESSION: No acute intracranial abnormality. Findings consistent with age related atrophy and chronic small vessel ischemia Electronically Signed   By: Prudencio Pair M.D.   On: 10/30/2019 21:12   ECHOCARDIOGRAM COMPLETE  Result Date: 10/31/2019   ECHOCARDIOGRAM REPORT   Patient Name:   Steven [name] Date of Exam: 10/31/2019 Medical Rec #:  [id number]      Height:       68.0 in Accession #:    [id number]     Weight:       170.0 lb Date of Birth:  1931-07-13      BSA:          1.91 m Patient Age:    43 years       BP:           139/98 mmHg Patient Gender: M  HR:           82 bpm. Exam Location:  Inpatient Procedure: 2D Echo, Cardiac Doppler and Color Doppler Indications:    TIA 435.9  History:        Patient has no prior history of Echocardiogram examinations.                 Risk Factors:Dyslipidemia and Former Smoker. GERD.  Sonographer:    Paulita Fujita RDCS Referring Phys: [id number] DAVID MANUEL [place]  1. Left ventricular ejection fraction, by visual estimation, is 60 to 65%. The left ventricle has normal function. There is no left ventricular hypertrophy.  2. Left ventricular diastolic parameters are consistent with Grade I diastolic dysfunction (impaired relaxation).  3. The left  ventricle has no regional wall motion abnormalities.  4. Global right ventricle has normal systolic function.The right ventricular size is normal. No increase in right ventricular wall thickness.  5. Left atrial size was normal.  6. Right atrial size was normal.  7. The mitral valve is normal in structure. Trivial mitral valve regurgitation. No evidence of mitral stenosis.  8. The tricuspid valve is normal in structure. Tricuspid valve regurgitation is not demonstrated.  9. The aortic valve is normal in structure. Aortic valve regurgitation is not visualized. No evidence of aortic valve sclerosis or stenosis. 10. The pulmonic valve was normal in structure. Pulmonic valve regurgitation is not visualized. 11. Normal pulmonary artery systolic pressure. 12. The inferior vena cava is normal in size with greater than 50% respiratory variability, suggesting right atrial pressure of 3 mmHg. FINDINGS  Left Ventricle: Left ventricular ejection fraction, by visual estimation, is 60 to 65%. The left ventricle has normal function. The left ventricle has no regional wall motion abnormalities. There is no left ventricular hypertrophy. Left ventricular diastolic parameters are consistent with Grade I diastolic dysfunction (impaired relaxation). Indeterminate filling pressures. Right Ventricle: The right ventricular size is normal. No increase in right ventricular wall thickness. Global RV systolic function is has normal systolic function. The tricuspid regurgitant velocity is 2.14 m/s, and with an assumed right atrial pressure  of 3 mmHg, the estimated right ventricular systolic pressure is normal at 21.3 mmHg. Left Atrium: Left atrial size was normal in size. Right Atrium: Right atrial size was normal in size Pericardium: There is no evidence of pericardial effusion. Mitral Valve: The mitral valve is normal in structure. Trivial mitral valve regurgitation. No evidence of mitral valve stenosis by observation. Tricuspid Valve: The  tricuspid valve is normal in structure. Tricuspid valve regurgitation is not demonstrated. Aortic Valve: The aortic valve is normal in structure. Aortic valve regurgitation is not visualized. The aortic valve is structurally normal, with no evidence of sclerosis or stenosis. Pulmonic Valve: The pulmonic valve was normal in structure. Pulmonic valve regurgitation is not visualized. Pulmonic regurgitation is not visualized. Aorta: The aortic root, ascending aorta and aortic arch are all structurally normal, with no evidence of dilitation or obstruction. Venous: The inferior vena cava is normal in size with greater than 50% respiratory variability, suggesting right atrial pressure of 3 mmHg. IAS/Shunts: No atrial level shunt detected by color flow Doppler. There is no evidence of a patent foramen ovale. No ventricular septal defect is seen or detected. There is no evidence of an atrial septal defect.  LEFT VENTRICLE PLAX 2D LVIDd:         4.50 cm  Diastology LVIDs:         3.50 cm  LV e' lateral:   6.31  cm/s LV PW:         0.70 cm  LV E/e' lateral: 10.3 LV IVS:        0.70 cm  LV e' medial:    4.57 cm/s LVOT diam:     2.00 cm  LV E/e' medial:  14.2 LV SV:         42 ml LV SV Index:   21.48 LVOT Area:     3.14 cm  RIGHT VENTRICLE RV S prime:     10.10 cm/s TAPSE (M-mode): 1.9 cm LEFT ATRIUM             Index       RIGHT ATRIUM           Index LA diam:        2.20 cm 1.15 cm/m  RA Area:     11.10 cm LA Vol (A2C):   28.8 ml 15.10 ml/m RA Volume:   19.40 ml  10.17 ml/m LA Vol (A4C):   26.4 ml 13.84 ml/m LA Biplane Vol: 29.1 ml 15.26 ml/m  AORTIC VALVE LVOT Vmax:   93.30 cm/s LVOT Vmean:  64.900 cm/s LVOT VTI:    0.200 m  AORTA Ao Root diam: 3.70 cm MITRAL VALVE                        TRICUSPID VALVE MV Area (PHT): 2.46 cm             TR Peak grad:   18.3 mmHg MV PHT:        89.61 msec           TR Vmax:        214.00 cm/s MV Decel Time: 309 msec MV E velocity: 65.10 cm/s 103 cm/s  SHUNTS MV A velocity: 96.80 cm/s  70.3 cm/s Systemic VTI:  0.20 m MV E/A ratio:  0.67       1.5       Systemic Diam: 2.00 cm  Dani Gobble Croitoru MD Electronically signed by Sanda Klein MD Signature Date/Time: 10/31/2019/1:52:19 PM    Final    VAS US CAROTID (at [facility] and [facility] only)  Result Date: 10/31/2019 Carotid Arterial Duplex Study Indications:       Weakness. Limitations        Today's exam was limited due to the patient's respiratory                    variation. Comparison Study:  No prior studies. Performing Technologist: Oliver Hum RVT  Examination Guidelines: A complete evaluation includes B-mode imaging, spectral Doppler, color Doppler, and power Doppler as needed of all accessible portions of each vessel. Bilateral testing is considered an integral part of a complete examination. Limited examinations for reoccurring indications may be performed as noted.  Right Carotid Findings: +----------+--------+--------+--------+-----------------------+--------+           PSV cm/sEDV cm/sStenosisPlaque Description     Comments +----------+--------+--------+--------+-----------------------+--------+ CCA Prox  77      18              smooth and heterogenoustortuous +----------+--------+--------+--------+-----------------------+--------+ CCA Distal34      7               smooth and heterogenous         +----------+--------+--------+--------+-----------------------+--------+ ICA Prox  41      14              smooth and heterogenoustortuous +----------+--------+--------+--------+-----------------------+--------+ ICA Distal62  22                                     tortuous +----------+--------+--------+--------+-----------------------+--------+ ECA       46      10                                              +----------+--------+--------+--------+-----------------------+--------+ +----------+--------+-------+--------+-------------------+           PSV cm/sEDV cmsDescribeArm Pressure (mmHG)  +----------+--------+-------+--------+-------------------+ [id number]                                         +----------+--------+-------+--------+-------------------+ +---------+--------+--+--------+--+---------+ VertebralPSV cm/s45EDV cm/s15Antegrade +---------+--------+--+--------+--+---------+  Left Carotid Findings: +----------+--------+--------+--------+-----------------------+--------+           PSV cm/sEDV cm/sStenosisPlaque Description     Comments +----------+--------+--------+--------+-----------------------+--------+ CCA Prox  76      17              smooth and heterogenoustortuous +----------+--------+--------+--------+-----------------------+--------+ CCA Distal45      11              smooth and heterogenous         +----------+--------+--------+--------+-----------------------+--------+ ICA Prox  30      6               smooth and heterogenoustortuous +----------+--------+--------+--------+-----------------------+--------+ ICA Distal54      21                                     tortuous +----------+--------+--------+--------+-----------------------+--------+ ECA       25      5                                               +----------+--------+--------+--------+-----------------------+--------+ +----------+--------+--------+--------+-------------------+           PSV cm/sEDV cm/sDescribeArm Pressure (mmHG) +----------+--------+--------+--------+-------------------+ Subclavian132                                         +----------+--------+--------+--------+-------------------+ +---------+--------+--+--------+-+---------+ VertebralPSV cm/s29EDV cm/s9Antegrade +---------+--------+--+--------+-+---------+  Summary: Right Carotid: Velocities in the right ICA are consistent with a 1-39% stenosis. Left Carotid: Velocities in the left ICA are consistent with a 1-39% stenosis. Vertebrals: Bilateral vertebral arteries demonstrate  antegrade flow. *See table(s) above for measurements and observations.  Electronically signed by Deitra Mayo MD on 10/31/2019 at 5:23:54 PM.    Final     Pending Labs Unresulted Labs (From admission, onward)   None      Vitals/Pain Today's Vitals   10/31/19 0614 10/31/19 0734 10/31/19 1352 10/31/19 1751  BP: (!) 139/98  112/76 (!) 127/96  Pulse: 84  (!) 105 90  Resp: 19  (!) 22 18  Temp: 98 F (36.7 C)  97.9 F (36.6 C)   TempSrc: Oral  Oral   SpO2: 94%  100% 100%  PainSc:  Asleep      Isolation Precautions No active isolations  Medications Medications   stroke: mapping our early stages of recovery book ( Does not apply Not Given 10/31/19 0116)  acetaminophen (TYLENOL) tablet 650 mg (has no administration in time range)  aspirin EC tablet 81 mg (81 mg Oral Given 10/31/19 0907)  pantoprazole (PROTONIX) EC tablet 40 mg (40 mg Oral Given 10/31/19 0907)  famotidine (PEPCID) tablet 20 mg (20 mg Oral Given 10/31/19 0124)  loratadine (CLARITIN) tablet 10 mg (10 mg Oral Given 10/31/19 [id number])  omega-3 acid ethyl esters (LOVAZA) capsule 1 g (1 g Oral Given 10/31/19 0907)  tamsulosin (FLOMAX) capsule 0.4 mg (0.4 mg Oral Given 10/31/19 0907)  heparin injection 5,000 Units (has no administration in time range)    Mobility walks with device High fall risk   Focused Assessments     Recommendations: See Admitting Provider Note  Report given to:   Additional Notes:

## 2019-10-31 NOTE — ED Notes (Signed)
Per patient request, Writer gave patient his personal cell phone (black iphone), white charger cord, and square plastic case containing his personal hearing aides.

## 2019-10-31 NOTE — Evaluation (Signed)
Physical Therapy Evaluation Patient Details Name: Steven [name] MRN: [id number] DOB: [birth year] Today's Date: 10/31/2019   History of Present Illness  85 y.o. male with medical history significant of GERD, BPH, hyperlipidemia, osteoporosis, osteoarthritis of the knees with history of vertebral fracture who is coming to the emergency department due to bilateral knee pain and LLE weakness.  unsteady gait and inability to stand at least one day  prior to admission.  CT head did not show any acute intracranial normality. MRI pending   Clinical Impression    Pt admitted with above diagnosis.  PT reports independence at his baseline, amb with rollator most of time, drives. Pt currently requiring mod assist overall. Unsafe to dc home,  Recommend SNF--pt's wife is at [facility] Pt currently with functional limitations due to the deficits listed below (see PT Problem List). Pt will benefit from skilled PT to increase their independence and safety with mobility to allow discharge to the venue listed below.     Follow Up Recommendations SNF    Equipment Recommendations  None recommended by PT    Recommendations for Other Services       Precautions / Restrictions Precautions Precautions: Fall Precaution Comments: 2 falls at home prior to admission Restrictions Weight Bearing Restrictions: No      Mobility  Bed Mobility Overal bed mobility: Needs Assistance Bed Mobility: Supine to Sit;Sit to Supine     Supine to sit: Min assist;Mod assist Sit to supine: Min assist   General bed mobility comments: assist with LEs and trunk to come to sit, assist with LEs on return to supine. incr time and effort needed  Transfers Overall transfer level: Needs assistance Equipment used: Rolling walker (2 wheeled) Transfers: Sit to/from Stand Sit to Stand: Mod assist;From elevated surface         General transfer comment: assist with anterior-superior wt shift and to control  descent  Ambulation/Gait             General Gait Details: took lateral steps along EOB with min/mod assist for balance and RW. wt shift R and L in standing with support of RW and assist to prevent posterior LOB. unable to attmept further amb safely with +1 assist d/t posterior LOB and L knee buckling  Stairs            Wheelchair Mobility    Modified Rankin (Stroke Patients Only)       Balance Overall balance assessment: Needs assistance Sitting-balance support: Feet supported;Bilateral upper extremity supported Sitting balance-Leahy Scale: Poor Sitting balance - Comments: repeated posterior LOB with pt only able to briefly maintain midline   Standing balance support: Bilateral upper extremity supported       Single Leg Stance - Left Leg: 0         High level balance activites: Side stepping High Level Balance Comments: posterior LOB repeatedly with pre-gait activities             Pertinent Vitals/Pain Pain Assessment: Faces Faces Pain Scale: Hurts little more Pain Location: L foot Pain Descriptors / Indicators: Sore Pain Intervention(s): Limited activity within patient's tolerance;Monitored during session    Home Living Family/patient expects to be discharged to:: Private residence Living Arrangements: Alone   Type of Home: House Home Access: Level entry     Home Layout: One level Home Equipment: Environmental consultant - 4 wheels;Transport chair Additional Comments: wife at [facility] for 3 years    Prior Function Level of Independence: Independent;Independent with assistive device(s)  Hand Dominance        Extremity/Trunk Assessment   Upper Extremity Assessment Upper Extremity Assessment: Overall WFL for tasks assessed(denies N/T or other changes/weakness LUE,grip fair bil, AROM WFL bil)    Lower Extremity Assessment Lower Extremity Assessment: LLE deficits/detail LLE Deficits / Details: L ankle df  limited to grossly 5  degrees, df/pf 2/5,   ankle inversion noted at rest, unable to evert,  L knee grossly 2+/5, hip 3/5. slight incr tone noted with activity       Communication   Communication: HOH  Cognition Arousal/Alertness: Awake/alert Behavior During Therapy: WFL for tasks assessed/performed Overall Cognitive Status: Within Functional Limits for tasks assessed                                        General Comments      Exercises     Assessment/Plan    PT Assessment Patient needs continued PT services  PT Problem List Decreased strength;Decreased activity tolerance;Decreased mobility;Decreased balance;Decreased knowledge of use of DME       PT Treatment Interventions DME instruction;Gait training;Functional mobility training;Therapeutic activities;Patient/family education;Therapeutic exercise    PT Goals (Current goals can be found in the Care Plan section)  Acute Rehab PT Goals Patient Stated Goal: walk PT Goal Formulation: With patient Time For Goal Achievement: 11/14/19 Potential to Achieve Goals: Good    Frequency Min 3X/week   Barriers to discharge        Co-evaluation               AM-PAC PT "6 Clicks" Mobility  Outcome Measure Help needed turning from your back to your side while in a flat bed without using bedrails?: A Little Help needed moving from lying on your back to sitting on the side of a flat bed without using bedrails?: A Little Help needed moving to and from a bed to a chair (including a wheelchair)?: A Lot Help needed standing up from a chair using your arms (e.g., wheelchair or bedside chair)?: A Lot Help needed to walk in hospital room?: A Lot Help needed climbing 3-5 steps with a railing? : Total 6 Click Score: 13    End of Session Equipment Utilized During Treatment: Gait belt Activity Tolerance: Patient limited by fatigue Patient left: with call bell/phone within reach;in bed;with bed alarm set   PT Visit Diagnosis: Difficulty  in walking, not elsewhere classified (R26.2)    Time: [id number] PT Time Calculation (min) (ACUTE ONLY): 33 min   Charges:   PT Evaluation $PT Eval Low Complexity: 1 Low PT Treatments $Therapeutic Activity: 8-22 mins        Baxter Flattery, PT   Acute Rehab Dept [facility]): [id number]   10/31/2019   [facility] 10/31/2019, 4:30 PM

## 2019-10-31 NOTE — Progress Notes (Signed)
  Echocardiogram 2D Echocardiogram has been performed.  Burnett Kanaris 10/31/2019, 10:44 AM

## 2019-11-01 DIAGNOSIS — K219 Gastro-esophageal reflux disease without esophagitis: Secondary | ICD-10-CM

## 2019-11-01 DIAGNOSIS — E785 Hyperlipidemia, unspecified: Secondary | ICD-10-CM

## 2019-11-01 NOTE — Progress Notes (Signed)
Physical Therapy Treatment Patient Details Name: Steven [name] MRN: [id number] DOB: 11-15-1930 Today's Date: 11/01/2019    History of Present Illness 85 y.o. male with medical history significant of GERD, BPH, hyperlipidemia, osteoporosis, osteoarthritis of the knees with history of vertebral fracture who is coming to the emergency department due to bilateral knee pain and LLE weakness.  unsteady gait and inability to stand at least one day  prior to admission.  CT head did not show any acute intracranial normality    PT Comments    Pt very agreeable and motivated to work with PT. Continues to exhibit LLE weakness, decr coordination as well as balance deficits (sitting and standing). Pt appears to have had a neurological event although CT negative. Continue to recommend SNF post acute   Follow Up Recommendations  SNF     Equipment Recommendations  None recommended by PT    Recommendations for Other Services       Precautions / Restrictions Precautions Precautions: Fall Precaution Comments: 2 falls at home prior to admission Restrictions Weight Bearing Restrictions: No    Mobility  Bed Mobility Overal bed mobility: Needs Assistance Bed Mobility: Supine to Sit     Supine to sit: Min assist;Mod assist;HOB elevated     General bed mobility comments: assist with LEs and trunk to come to sit, assist with LEs on return to supine. incr time and effort needed  Transfers Overall transfer level: Needs assistance Equipment used: Rolling walker (2 wheeled) Transfers: Sit to/from Stand Sit to Stand: Mod assist;+2 physical assistance;+2 safety/equipment         General transfer comment: assist with anterior-superior wt shift and to control descent  Ambulation/Gait Ambulation/Gait assistance: Mod assist;+2 physical assistance;+2 safety/equipment;Max assist Gait Distance (Feet): 4 Feet Assistive device: Rolling walker (2 wheeled) Gait Pattern/deviations: Step-to pattern;Trunk  flexed;Narrow base of support;Decreased stance time - left;Decreased weight shift to left     General Gait Details: pt with L lateral and posterior LOB, requiring assist to maintain midline and to wt shift to advance LEs    Stairs             Wheelchair Mobility    Modified Rankin (Stroke Patients Only) Modified Rankin (Stroke Patients Only) Pre-Morbid Rankin Score: No significant disability Modified Rankin: Moderately severe disability     Balance   Sitting-balance support: Feet supported;Bilateral upper extremity supported Sitting balance-Leahy Scale: Poor Sitting balance - Comments:  posterior LOB with pt only able to briefly maintain midline   Standing balance support: Bilateral upper extremity supported                                Cognition Arousal/Alertness: Awake/alert Behavior During Therapy: WFL for tasks assessed/performed Overall Cognitive Status: Within Functional Limits for tasks assessed                                        Exercises      General Comments        Pertinent Vitals/Pain Pain Assessment: No/denies pain    Home Living                      Prior Function            PT Goals (current goals can now be found in the care plan section) Acute Rehab PT Goals  Patient Stated Goal: walk PT Goal Formulation: With patient Time For Goal Achievement: 11/14/19 Potential to Achieve Goals: Good Progress towards PT goals: Progressing toward goals    Frequency    Min 3X/week      PT Plan Current plan remains appropriate    Co-evaluation              AM-PAC PT "6 Clicks" Mobility   Outcome Measure  Help needed turning from your back to your side while in a flat bed without using bedrails?: A Little Help needed moving from lying on your back to sitting on the side of a flat bed without using bedrails?: A Little Help needed moving to and from a bed to a chair (including a wheelchair)?:  A Lot Help needed standing up from a chair using your arms (e.g., wheelchair or bedside chair)?: A Lot Help needed to walk in hospital room?: Total Help needed climbing 3-5 steps with a railing? : Total 6 Click Score: 12    End of Session Equipment Utilized During Treatment: Gait belt Activity Tolerance: Patient tolerated treatment well Patient left: with call bell/phone within reach;in chair;with chair alarm set Nurse Communication: Mobility status PT Visit Diagnosis: Difficulty in walking, not elsewhere classified (R26.2)     Time: [id number] PT Time Calculation (min) (ACUTE ONLY): 15 min  Charges:  $Therapeutic Activity: 8-22 mins                     Baxter Flattery, PT   Acute Rehab Dept [facility]): [id number]   11/01/2019    [facility] 11/01/2019, 12:49 PM

## 2019-11-01 NOTE — Plan of Care (Signed)

## 2019-11-01 NOTE — Progress Notes (Signed)
Progress Note    Steven [name]  [id number] DOB: February 21, 1931  DOA: 10/30/2019 PCP: Mayra Neer, MD   HPI Steven [name] is a 85 y.o. male with medical history significant of GERD, BPH, hyperlipidemia, osteoporosis, osteoarthritis of the knees with history of vertebral fracture who is coming to the emergency department due to bilateral knee pain and LLE weakness.  He has being unable to stand up since yesterday evening at 2100.  He denies headache, blurred vision, slurred speech, nausea or emesis, but states that his gait is unsteady.  He normally uses a walker at home. In the ED, VSS, Lumbar spine radiographs shows previous findings, but no acute abnormalities.  CT head did not show any acute intracranial normality.  Pt admitted for further management    Assessment/Plan:   Principal Problem:   Arthritis of knee, bilateral Active Problems:   GERD (gastroesophageal reflux disease)   Weakness of left lower extremity   BPH (benign prostatic hyperplasia)   Hyperlipemia   Lower extremity weakness    Bilateral knee arthritis/weakness of left lower extremities Frequent neuro checks Carotid Doppler unremarkable, CT head negative Echo with EF of 60 to [id number], grade 1 diastolic dysfunction MRI brain pending PT and OT Follow-up with social worker to assist with disposition  Multilevel lumbar degenerative disc disease Analgesics as needed for pain  BPH Continue Flomax  GERD Continue Protonix   Family Communication/Anticipated D/C date and plan/Code Status   DVT prophylaxis: Heparin Code Status: Full code Family Communication: None at bedside Disposition Plan: SNF      Subjective:   Pt still complains of LLE weakness, inability to walk. Denies any new complaints  Objective:    Vitals:   10/31/19 2132 11/01/19 0113 11/01/19 0513 11/01/19 1356  BP: (!) 139/118 125/87 (!) 135/101 101/90  Pulse: 81 80 78 89  Resp:  20 16 17   Temp: 98.7 F (37.1 C) 97.7 F  (36.5 C) 97.8 F (36.6 C) (!) 97.4 F (36.3 C)  TempSrc: Oral Oral Oral Oral  SpO2: 96% 95% 97% 97%  Weight:      Height:        Intake/Output Summary (Last 24 hours) at 11/01/2019 2039 Last data filed at 11/01/2019 1853 Gross per 24 hour  Intake 760 ml  Output 1800 ml  Net -1040 ml   Filed Weights   10/31/19 2120  Weight: 80.2 kg    Exam:  General: NAD   Cardiovascular: S1, S2 present  Respiratory: CTAB  Abdomen: Soft, nontender, nondistended, bowel sounds present  Musculoskeletal: No bilateral pedal edema noted  Skin: Normal  Psychiatry: Normal mood  Neurology: Strength equal in all extremities except for LLE. No other obvious focal neurologic deficit     Data Reviewed:   I have personally reviewed following labs and imaging studies:  Labs: Labs show the following:   Basic Metabolic Panel: Recent Labs  Lab 10/30/19 2050  NA 139  K 3.7  CL 103  CO2 26  GLUCOSE 107*  BUN 13  CREATININE 0.54*  CALCIUM 9.0   GFR Estimated Creatinine Clearance: 64.1 mL/min (A) (by C-G formula based on SCr of 0.54 mg/dL (L)). Liver Function Tests: Recent Labs  Lab 10/30/19 2050  AST 25  ALT 26  ALKPHOS 55  BILITOT 0.8  PROT 7.4  ALBUMIN 4.2   No results for input(s): LIPASE, AMYLASE in the last 168 hours. No results for input(s): AMMONIA in the last 168 hours. Coagulation profile No results for input(s):  INR, PROTIME in the last 168 hours.  CBC: Recent Labs  Lab 10/30/19 2050  WBC 8.1  HGB 15.5  HCT 47.4  MCV 90.8  PLT 223   Cardiac Enzymes: No results for input(s): CKTOTAL, CKMB, CKMBINDEX, TROPONINI in the last 168 hours. BNP (last 3 results) No results for input(s): PROBNP in the last 8760 hours. CBG: No results for input(s): GLUCAP in the last 168 hours. D-Dimer: No results for input(s): DDIMER in the last 72 hours. Hgb A1c: Recent Labs    10/31/19 0336  HGBA1C 5.9*   Lipid Profile: Recent Labs    10/31/19 0336  CHOL 221*    HDL 47  LDLCALC 157*  TRIG 83  CHOLHDL 4.7   Thyroid function studies: No results for input(s): TSH, T4TOTAL, T3FREE, THYROIDAB in the last 72 hours.  Invalid input(s): FREET3 Anemia work up: No results for input(s): VITAMINB12, FOLATE, FERRITIN, TIBC, IRON, RETICCTPCT in the last 72 hours. Sepsis Labs: Recent Labs  Lab 10/30/19 2050  WBC 8.1    Microbiology Recent Results (from the past 240 hour(s))  SARS CORONAVIRUS 2 (TAT 6-24 HRS) Nasopharyngeal Nasopharyngeal Swab     Status: None   Collection Time: 10/30/19 10:13 PM   Specimen: Nasopharyngeal Swab  Result Value Ref Range Status   SARS Coronavirus 2 NEGATIVE NEGATIVE Final    Comment: (NOTE) SARS-CoV-2 target nucleic acids are NOT DETECTED. The SARS-CoV-2 RNA is generally detectable in upper and lower respiratory specimens during the acute phase of infection. Negative results do not preclude SARS-CoV-2 infection, do not rule out co-infections with other pathogens, and should not be used as the sole basis for treatment or other patient management decisions. Negative results must be combined with clinical observations, patient history, and epidemiological information. The expected result is Negative. Fact Sheet for Patients: SugarRoll.be Fact Sheet for Healthcare Providers: https://www.woods-mathews.com/ This test is not yet approved or cleared by the [place] FDA and  has been authorized for detection and/or diagnosis of SARS-CoV-2 by FDA under an Emergency Use Authorization (EUA). This EUA will remain  in effect (meaning this test can be used) for the duration of the COVID-19 declaration under Section 56 4(b)(1) of the Act, 21 U.S.C. section 360bbb-3(b)(1), unless the authorization is terminated or revoked sooner. Performed at [facility], [place] [address] [place], [place] [address]     Procedures and diagnostic studies:  DG Lumbar Spine Complete  Result  Date: 10/30/2019 CLINICAL DATA:  [place] EXAM: LUMBAR SPINE - COMPLETE 4+ VIEW COMPARISON:  2019 FINDINGS: Lumbar vertebral body heights and alignment are similar to the prior study including compression deformity of L4 and trace anterolisthesis at L4-L5. Multilevel degenerative disc disease is present with disc space narrowing and small endplate osteophytes. There is facet hypertrophy, greater at lower lumbar levels. IMPRESSION: No new compression deformity. Similar appearance of multilevel degenerative changes. Electronically Signed   By: Macy Mis M.D.   On: 10/30/2019 21:28   CT Head Wo Contrast  Result Date: 10/30/2019 CLINICAL DATA:  Neuro deficit, unable to stand EXAM: CT HEAD WITHOUT CONTRAST TECHNIQUE: Contiguous axial images were obtained from the base of the skull through the vertex without intravenous contrast. COMPARISON:  None. FINDINGS: Brain: No evidence of acute territorial infarction, hemorrhage, hydrocephalus,extra-axial collection or mass lesion/mass effect. There is dilatation the ventricles and sulci consistent with age-related atrophy. Low-attenuation changes in the deep white matter consistent with small vessel ischemia. Vascular: No hyperdense vessel or unexpected calcification. Skull: The skull is intact. No fracture  or focal lesion identified. Sinuses/Orbits: The visualized paranasal sinuses and mastoid air cells are clear. The orbits and globes intact. Other: None IMPRESSION: No acute intracranial abnormality. Findings consistent with age related atrophy and chronic small vessel ischemia Electronically Signed   By: Prudencio Pair M.D.   On: 10/30/2019 21:12   ECHOCARDIOGRAM COMPLETE  Result Date: 10/31/2019   ECHOCARDIOGRAM REPORT   Patient Name:   JAKEVIOUS LICAUSI Date of Exam: 10/31/2019 Medical Rec #:  [id number]      Height:       68.0 in Accession #:    [id number]     Weight:       170.0 lb Date of Birth:  [birth year]      BSA:          1.91 m Patient Age:    3 years        BP:           139/98 mmHg Patient Gender: M              HR:           82 bpm. Exam Location:  Inpatient Procedure: 2D Echo, Cardiac Doppler and Color Doppler Indications:    TIA 435.9  History:        Patient has no prior history of Echocardiogram examinations.                 Risk Factors:Dyslipidemia and Former Smoker. GERD.  Sonographer:    Paulita Fujita RDCS Referring Phys: [id number] DAVID MANUEL [place]  1. Left ventricular ejection fraction, by visual estimation, is 60 to 65%. The left ventricle has normal function. There is no left ventricular hypertrophy.  2. Left ventricular diastolic parameters are consistent with Grade I diastolic dysfunction (impaired relaxation).  3. The left ventricle has no regional wall motion abnormalities.  4. Global right ventricle has normal systolic function.The right ventricular size is normal. No increase in right ventricular wall thickness.  5. Left atrial size was normal.  6. Right atrial size was normal.  7. The mitral valve is normal in structure. Trivial mitral valve regurgitation. No evidence of mitral stenosis.  8. The tricuspid valve is normal in structure. Tricuspid valve regurgitation is not demonstrated.  9. The aortic valve is normal in structure. Aortic valve regurgitation is not visualized. No evidence of aortic valve sclerosis or stenosis. 10. The pulmonic valve was normal in structure. Pulmonic valve regurgitation is not visualized. 11. Normal pulmonary artery systolic pressure. 12. The inferior vena cava is normal in size with greater than 50% respiratory variability, suggesting right atrial pressure of 3 mmHg. FINDINGS  Left Ventricle: Left ventricular ejection fraction, by visual estimation, is 60 to 65%. The left ventricle has normal function. The left ventricle has no regional wall motion abnormalities. There is no left ventricular hypertrophy. Left ventricular diastolic parameters are consistent with Grade I diastolic dysfunction (impaired  relaxation). Indeterminate filling pressures. Right Ventricle: The right ventricular size is normal. No increase in right ventricular wall thickness. Global RV systolic function is has normal systolic function. The tricuspid regurgitant velocity is 2.14 m/s, and with an assumed right atrial pressure  of 3 mmHg, the estimated right ventricular systolic pressure is normal at 21.3 mmHg. Left Atrium: Left atrial size was normal in size. Right Atrium: Right atrial size was normal in size Pericardium: There is no evidence of pericardial effusion. Mitral Valve: The mitral valve is normal in structure. Trivial mitral valve regurgitation. No evidence of mitral valve stenosis  by observation. Tricuspid Valve: The tricuspid valve is normal in structure. Tricuspid valve regurgitation is not demonstrated. Aortic Valve: The aortic valve is normal in structure. Aortic valve regurgitation is not visualized. The aortic valve is structurally normal, with no evidence of sclerosis or stenosis. Pulmonic Valve: The pulmonic valve was normal in structure. Pulmonic valve regurgitation is not visualized. Pulmonic regurgitation is not visualized. Aorta: The aortic root, ascending aorta and aortic arch are all structurally normal, with no evidence of dilitation or obstruction. Venous: The inferior vena cava is normal in size with greater than 50% respiratory variability, suggesting right atrial pressure of 3 mmHg. IAS/Shunts: No atrial level shunt detected by color flow Doppler. There is no evidence of a patent foramen ovale. No ventricular septal defect is seen or detected. There is no evidence of an atrial septal defect.  LEFT VENTRICLE PLAX 2D LVIDd:         4.50 cm  Diastology LVIDs:         3.50 cm  LV e' lateral:   6.31 cm/s LV PW:         0.70 cm  LV E/e' lateral: 10.3 LV IVS:        0.70 cm  LV e' medial:    4.57 cm/s LVOT diam:     2.00 cm  LV E/e' medial:  14.2 LV SV:         42 ml LV SV Index:   21.48 LVOT Area:     3.14 cm  RIGHT  VENTRICLE RV S prime:     10.10 cm/s TAPSE (M-mode): 1.9 cm LEFT ATRIUM             Index       RIGHT ATRIUM           Index LA diam:        2.20 cm 1.15 cm/m  RA Area:     11.10 cm LA Vol (A2C):   28.8 ml 15.10 ml/m RA Volume:   19.40 ml  10.17 ml/m LA Vol (A4C):   26.4 ml 13.84 ml/m LA Biplane Vol: 29.1 ml 15.26 ml/m  AORTIC VALVE LVOT Vmax:   93.30 cm/s LVOT Vmean:  64.900 cm/s LVOT VTI:    0.200 m  AORTA Ao Root diam: 3.70 cm MITRAL VALVE                        TRICUSPID VALVE MV Area (PHT): 2.46 cm             TR Peak grad:   18.3 mmHg MV PHT:        89.61 msec           TR Vmax:        214.00 cm/s MV Decel Time: 309 msec MV E velocity: 65.10 cm/s 103 cm/s  SHUNTS MV A velocity: 96.80 cm/s 70.3 cm/s Systemic VTI:  0.20 m MV E/A ratio:  0.67       1.5       Systemic Diam: 2.00 cm  Dani Gobble Croitoru MD Electronically signed by Sanda Klein MD Signature Date/Time: 10/31/2019/1:52:19 PM    Final    VAS US CAROTID (at [facility] and [facility] only)  Result Date: 10/31/2019 Carotid Arterial Duplex Study Indications:       Weakness. Limitations        Today's exam was limited due to the patient's respiratory  variation. Comparison Study:  No prior studies. Performing Technologist: Oliver Hum RVT  Examination Guidelines: A complete evaluation includes B-mode imaging, spectral Doppler, color Doppler, and power Doppler as needed of all accessible portions of each vessel. Bilateral testing is considered an integral part of a complete examination. Limited examinations for reoccurring indications may be performed as noted.  Right Carotid Findings: +----------+--------+--------+--------+-----------------------+--------+           PSV cm/sEDV cm/sStenosisPlaque Description     Comments +----------+--------+--------+--------+-----------------------+--------+ CCA Prox  77      18              smooth and heterogenoustortuous +----------+--------+--------+--------+-----------------------+--------+  CCA Distal34      7               smooth and heterogenous         +----------+--------+--------+--------+-----------------------+--------+ ICA Prox  41      14              smooth and heterogenoustortuous +----------+--------+--------+--------+-----------------------+--------+ ICA Distal62      22                                     tortuous +----------+--------+--------+--------+-----------------------+--------+ ECA       46      10                                              +----------+--------+--------+--------+-----------------------+--------+ +----------+--------+-------+--------+-------------------+           PSV cm/sEDV cmsDescribeArm Pressure (mmHG) +----------+--------+-------+--------+-------------------+ [id number]                                         +----------+--------+-------+--------+-------------------+ +---------+--------+--+--------+--+---------+ VertebralPSV cm/s45EDV cm/s15Antegrade +---------+--------+--+--------+--+---------+  Left Carotid Findings: +----------+--------+--------+--------+-----------------------+--------+           PSV cm/sEDV cm/sStenosisPlaque Description     Comments +----------+--------+--------+--------+-----------------------+--------+ CCA Prox  76      17              smooth and heterogenoustortuous +----------+--------+--------+--------+-----------------------+--------+ CCA Distal45      11              smooth and heterogenous         +----------+--------+--------+--------+-----------------------+--------+ ICA Prox  30      6               smooth and heterogenoustortuous +----------+--------+--------+--------+-----------------------+--------+ ICA Distal54      21                                     tortuous +----------+--------+--------+--------+-----------------------+--------+ ECA       25      5                                                +----------+--------+--------+--------+-----------------------+--------+ +----------+--------+--------+--------+-------------------+           PSV cm/sEDV cm/sDescribeArm Pressure (mmHG) +----------+--------+--------+--------+-------------------+ Subclavian132                                         +----------+--------+--------+--------+-------------------+ +---------+--------+--+--------+-+---------+  VertebralPSV cm/s29EDV cm/s9Antegrade +---------+--------+--+--------+-+---------+  Summary: Right Carotid: Velocities in the right ICA are consistent with a 1-39% stenosis. Left Carotid: Velocities in the left ICA are consistent with a 1-39% stenosis. Vertebrals: Bilateral vertebral arteries demonstrate antegrade flow. *See table(s) above for measurements and observations.  Electronically signed by Deitra Mayo MD on 10/31/2019 at 5:23:54 PM.    Final     Medications:   .  stroke: mapping our early stages of recovery book   Does not apply Once  . aspirin EC  81 mg Oral QODAY  . famotidine  20 mg Oral QHS  . heparin injection (subcutaneous)  5,000 Units Subcutaneous Q8H  . loratadine  10 mg Oral Daily  . omega-3 acid ethyl esters  1 g Oral BID  . pantoprazole  40 mg Oral BID  . tamsulosin  0.4 mg Oral Daily   Continuous Infusions:   LOS: 1 day   Alma Friendly, MD  [facility]    11/01/2019, 8:39 PM

## 2019-11-01 NOTE — Evaluation (Signed)
Occupational Therapy Evaluation Patient Details Name: Steven [name] MRN: [id number] DOB: 1931/07/20 Today's Date: 11/01/2019    History of Present Illness 85 y.o. male with medical history significant of GERD, BPH, hyperlipidemia, osteoporosis, osteoarthritis of the knees with history of vertebral fracture who is coming to the emergency department due to bilateral knee pain and LLE weakness.  unsteady gait and inability to stand at least one day  prior to admission.  CT head did not show any acute intracranial normality   Clinical Impression   Pt admitted with weakness. Pt currently with functional limitations due to the deficits listed below (see OT Problem List).  Pt will benefit from skilled OT to increase their safety and independence with ADL and functional mobility for ADL to facilitate discharge to venue listed below.      Follow Up Recommendations  SNF    Equipment Recommendations  None recommended by OT    Recommendations for Other Services       Precautions / Restrictions Precautions Precautions: Fall Precaution Comments: 2 falls at home prior to admission Restrictions Weight Bearing Restrictions: No      Mobility Bed Mobility               General bed mobility comments: pt OOB  Transfers Overall transfer level: Needs assistance Equipment used: Rolling walker (2 wheeled) Transfers: Sit to/from Stand Sit to Stand: +2 physical assistance;+2 safety/equipment;Max assist              Balance   Sitting-balance support: Feet supported;Bilateral upper extremity supported Sitting balance-Leahy Scale: Poor Sitting balance - Comments:  posterior LOB with pt only able to briefly maintain midline   Standing balance support: Bilateral upper extremity supported                               ADL either performed or assessed with clinical judgement   ADL Overall ADL's : Needs assistance/impaired Eating/Feeding: Set up;Sitting   Grooming: Oral  care;Wash/dry hands;Wash/dry face;Set up;Sitting   Upper Body Bathing: Set up   Lower Body Bathing: Moderate assistance;Sit to/from stand;Cueing for safety;Cueing for compensatory techniques   Upper Body Dressing : Minimal assistance;Sitting   Lower Body Dressing: Maximal assistance;+2 for safety/equipment;+2 for physical assistance   Toilet Transfer: Maximal assistance;+2 for safety/equipment;+2 for physical assistance Toilet Transfer Details (indicate cue type and reason): standing with urinal           General ADL Comments: needed plus 2 A for sit to stand from chair to use urinal     Vision Patient Visual Report: No change from baseline              Pertinent Vitals/Pain Pain Score: 3  Pain Location: knees Pain Descriptors / Indicators: Sore Pain Intervention(s): Limited activity within patient's tolerance;Monitored during session     Hand Dominance     Extremity/Trunk Assessment Upper Extremity Assessment Upper Extremity Assessment: Generalized weakness           Communication Communication Communication: HOH   Cognition Arousal/Alertness: Awake/alert Behavior During Therapy: WFL for tasks assessed/performed Overall Cognitive Status: Within Functional Limits for tasks assessed                                                Home Living Family/patient expects to be discharged to:: Private residence  Living Arrangements: Alone   Type of Home: House Home Access: Level entry     Home Layout: One level     Bathroom Shower/Tub: Tub/shower unit         Home Equipment: Walker - 4 wheels;Transport chair   Additional Comments: wife at [facility] for 3 years      Prior Functioning/Environment Level of Independence: Independent;Independent with assistive device(s)                 OT Problem List: Decreased strength;Decreased activity tolerance;Impaired balance (sitting and/or standing)      OT Treatment/Interventions:  Self-care/ADL training;DME and/or AE instruction;Patient/family education    OT Goals(Current goals can be found in the care plan section) Acute Rehab OT Goals Patient Stated Goal: walk OT Goal Formulation: With patient Time For Goal Achievement: 11/08/19  OT Frequency: Min 2X/week   Barriers to D/C:               AM-PAC OT "6 Clicks" Daily Activity     Outcome Measure Help from another person eating meals?: None Help from another person taking care of personal grooming?: A Little Help from another person toileting, which includes using toliet, bedpan, or urinal?: A Lot Help from another person bathing (including washing, rinsing, drying)?: A Lot Help from another person to put on and taking off regular upper body clothing?: A Little Help from another person to put on and taking off regular lower body clothing?: A Lot 6 Click Score: 16   End of Session Equipment Utilized During Treatment: Rolling walker;Gait belt Nurse Communication: Mobility status  Activity Tolerance: Patient tolerated treatment well Patient left: in chair;with call bell/phone within reach;with nursing/sitter in room  OT Visit Diagnosis: Unsteadiness on feet (R26.81);Muscle weakness (generalized) (M62.81);Other abnormalities of gait and mobility (R26.89);History of falling (Z91.81)                Time: [id number] OT Time Calculation (min): 17 min Charges:  OT General Charges $OT Visit: 1 Visit OT Evaluation $OT Eval Moderate Complexity: 1 Mod  Kari Baars, [place] [phone] Office- [phone]     Reynard Christoffersen, Edwena Felty D 11/01/2019, 4:20 PM

## 2019-11-01 NOTE — NC FL2 (Signed)
[place] LEVEL OF CARE SCREENING TOOL     IDENTIFICATION  Patient Name: Steven [name] Birthdate: 1931/09/15 Sex: male Admission Date (Current Location): 10/30/2019  [facility] and [place] Number:  Herbalist and Address:         Provider Number: [id number]  Attending Physician Name and Address:  Alma Friendly, MD  Relative Name and Phone Number:       Current Level of Care: Hospital Recommended Level of Care: [place] Prior Approval Number:    Date Approved/Denied:   PASRR Number: [id number]  Discharge Plan: SNF    Current Diagnoses: Patient Active Problem List   Diagnosis Date Noted  . Arthritis of knee, bilateral 10/31/2019  . Lower extremity weakness 10/31/2019  . Weakness of left lower extremity 10/30/2019  . BPH (benign prostatic hyperplasia)   . Hyperlipemia   . Chronic cough 05/23/2018  . NSIP (nonspecific interstitial pneumonitis) ([place]) 05/23/2018  . GERD (gastroesophageal reflux disease) 05/20/2018  . Wears hearing aid 05/20/2018  . Ectropion of both eyes 07/24/2015  . Nuclear cataract 06/03/2012    Orientation RESPIRATION BLADDER Height & Weight     Self, Time, Situation, Place  Normal Continent Weight: 80.2 kg Height:  5\' 8"  (172.7 cm)  BEHAVIORAL SYMPTOMS/MOOD NEUROLOGICAL BOWEL NUTRITION STATUS      Continent Diet(regular)  AMBULATORY STATUS COMMUNICATION OF NEEDS Skin   Extensive Assist Verbally Normal                       Personal Care Assistance Level of Assistance  Bathing, Feeding, Dressing Bathing Assistance: Limited assistance Feeding assistance: Limited assistance Dressing Assistance: Limited assistance     Functional Limitations Info             SPECIAL CARE FACTORS FREQUENCY  PT (By licensed PT)     PT Frequency: 5xweekl;y              Contractures Contractures Info: Not present    Additional Factors Info  Code Status Code Status Info: full              Current Medications (11/01/2019):  This is the current hospital active medication list Current Facility-Administered Medications  Medication Dose Route Frequency Provider Last Rate Last Admin  .  stroke: mapping our early stages of recovery book   Does not apply Once Reubin Milan, MD      . acetaminophen (TYLENOL) tablet 650 mg  650 mg Oral Q8H PRN Reubin Milan, MD      . aspirin EC tablet 81 mg  81 mg Oral Glenna Durand, MD   81 mg at 10/31/19 [id number]  . famotidine (PEPCID) tablet 20 mg  20 mg Oral QHS Reubin Milan, MD   20 mg at 10/31/19 2210  . heparin injection 5,000 Units  5,000 Units Subcutaneous Q8H Jennye Boroughs, MD   5,000 Units at 11/01/19 [phone]  . loratadine (CLARITIN) tablet 10 mg  10 mg Oral Daily Reubin Milan, MD   10 mg at 10/31/19 [id number]  . omega-3 acid ethyl esters (LOVAZA) capsule 1 g  1 g Oral BID Reubin Milan, MD   1 g at 10/31/19 2210  . pantoprazole (PROTONIX) EC tablet 40 mg  40 mg Oral BID Reubin Milan, MD   40 mg at 10/31/19 2210  . tamsulosin (FLOMAX) capsule 0.4 mg  0.4 mg Oral Daily Reubin Milan, MD   0.4 mg at 10/31/19  0907     Discharge Medications: Please see discharge summary for a list of discharge medications.  Relevant Imaging Results:  Relevant Lab Results:   Additional Information [id number]  Leeroy Cha, RN

## 2019-11-01 NOTE — TOC Initial Note (Addendum)
Transition of Care [facility]) - Initial/Assessment Note    Patient Details  Name: Steven [name] MRN: [id number] Date of Birth: Jul 08, 1931  [facility] CM/SW Contact:    Leeroy Cha, RN Phone Number: 11/01/2019, 10:11 AM  Clinical Narrative:                 Faxed out to the area snf for review/wife is at [facility]. [facility] [place]/patient has a bed. Need progress notes and pt notes/pt notes sent to [facility] via hub. Expected Discharge Plan: Skilled Nursing Facility Barriers to Discharge: No Barriers Identified   Patient Goals and CMS Choice     Choice offered to / list presented to : Patient  Expected Discharge Plan and Services Expected Discharge Plan: Skilled Nursing Facility In-house Referral: Clinical Social Work Discharge Planning Services: CM Consult Post Acute Care Choice: [place] Living arrangements for the past 2 months: [place]                                      Prior Living Arrangements/Services Living arrangements for the past 2 months: Single Family Home Lives with:: Self Patient language and need for interpreter reviewed:: No Do you feel safe going back to the place where you live?: Yes      Need for Family Participation in Patient Care: No (Comment) Care giver support system in place?: No (comment)   Criminal Activity/Legal Involvement Pertinent to Current Situation/Hospitalization: No - Comment as needed  Activities of Daily Living Home Assistive Devices/Equipment: Dentures (specify type), Hearing aid, Walker (specify type)(upper denture, bilateral hearing aids) ADL Screening (condition at time of admission) Patient's cognitive ability adequate to safely complete daily activities?: Yes Is the patient deaf or have difficulty hearing?: Yes(wears bilateral hearing aids) Does the patient have difficulty seeing, even when wearing glasses/contacts?: Yes Does the patient have  difficulty concentrating, remembering, or making decisions?: No Patient able to express need for assistance with ADLs?: Yes Does the patient have difficulty dressing or bathing?: Yes Independently performs ADLs?: No Communication: Independent Dressing (OT): Needs assistance Is this a change from baseline?: Change from baseline, expected to last >3 days Grooming: Needs assistance Is this a change from baseline?: Change from baseline, expected to last >3 days Feeding: Needs assistance Is this a change from baseline?: Change from baseline, expected to last >3 days Bathing: Needs assistance Is this a change from baseline?: Change from baseline, expected to last >3 days Toileting: Dependent Is this a change from baseline?: Change from baseline, expected to last >3days In/Out Bed: Dependent Is this a change from baseline?: Change from baseline, expected to last >3 days Walks in Home: Dependent Is this a change from baseline?: Change from baseline, expected to last >3 days Does the patient have difficulty walking or climbing stairs?: Yes(secondary to left leg weakness) Weakness of Legs: Left Weakness of Arms/Hands: None  Permission Sought/Granted                  Emotional Assessment Appearance:: Appears stated age     Orientation: : Oriented to Self, Oriented to Place, Oriented to  Time, Oriented to Situation Alcohol / Substance Use: Not Applicable Psych Involvement: No (comment)  Admission diagnosis:  Lower extremity weakness [R29.898] Weakness of left lower extremity [R29.898] Patient Active Problem List   Diagnosis Date Noted  . Arthritis of knee, bilateral 10/31/2019  . Lower  extremity weakness 10/31/2019  . Weakness of left lower extremity 10/30/2019  . BPH (benign prostatic hyperplasia)   . Hyperlipemia   . Chronic cough 05/23/2018  . NSIP (nonspecific interstitial pneumonitis) ([place]) 05/23/2018  . GERD (gastroesophageal reflux disease) 05/20/2018  . Wears hearing  aid 05/20/2018  . Ectropion of both eyes 07/24/2015  . Nuclear cataract 06/03/2012   PCP:  Mayra Neer, MD Pharmacy:   [facility] [address] [place] - [address] [place] [address] Phone: [phone] Fax: [phone]     Social Determinants of Health (SDOH) Interventions    Readmission Risk Interventions No flowsheet data found.

## 2019-11-02 ENCOUNTER — Inpatient Hospital Stay (HOSPITAL_COMMUNITY): Payer: Medicare HMO

## 2019-11-02 DIAGNOSIS — I639 Cerebral infarction, unspecified: Secondary | ICD-10-CM | POA: Diagnosis not present

## 2019-11-02 DIAGNOSIS — Z87311 Personal history of (healed) other pathological fracture: Secondary | ICD-10-CM | POA: Diagnosis not present

## 2019-11-02 DIAGNOSIS — M1990 Unspecified osteoarthritis, unspecified site: Secondary | ICD-10-CM | POA: Diagnosis not present

## 2019-11-02 DIAGNOSIS — R279 Unspecified lack of coordination: Secondary | ICD-10-CM | POA: Diagnosis not present

## 2019-11-02 DIAGNOSIS — Z7401 Bed confinement status: Secondary | ICD-10-CM | POA: Diagnosis not present

## 2019-11-02 DIAGNOSIS — R2689 Other abnormalities of gait and mobility: Secondary | ICD-10-CM | POA: Diagnosis not present

## 2019-11-02 DIAGNOSIS — J3489 Other specified disorders of nose and nasal sinuses: Secondary | ICD-10-CM | POA: Diagnosis not present

## 2019-11-02 DIAGNOSIS — E785 Hyperlipidemia, unspecified: Secondary | ICD-10-CM | POA: Diagnosis not present

## 2019-11-02 DIAGNOSIS — J302 Other seasonal allergic rhinitis: Secondary | ICD-10-CM | POA: Diagnosis not present

## 2019-11-02 DIAGNOSIS — R2681 Unsteadiness on feet: Secondary | ICD-10-CM | POA: Diagnosis not present

## 2019-11-02 DIAGNOSIS — I69844 Monoplegia of lower limb following other cerebrovascular disease affecting left non-dominant side: Secondary | ICD-10-CM | POA: Diagnosis not present

## 2019-11-02 DIAGNOSIS — K219 Gastro-esophageal reflux disease without esophagitis: Secondary | ICD-10-CM | POA: Diagnosis not present

## 2019-11-02 DIAGNOSIS — I6782 Cerebral ischemia: Secondary | ICD-10-CM | POA: Diagnosis not present

## 2019-11-02 DIAGNOSIS — M5030 Other cervical disc degeneration, unspecified cervical region: Secondary | ICD-10-CM | POA: Diagnosis not present

## 2019-11-02 DIAGNOSIS — M2550 Pain in unspecified joint: Secondary | ICD-10-CM | POA: Diagnosis not present

## 2019-11-02 DIAGNOSIS — Z8673 Personal history of transient ischemic attack (TIA), and cerebral infarction without residual deficits: Secondary | ICD-10-CM | POA: Diagnosis not present

## 2019-11-02 DIAGNOSIS — M25561 Pain in right knee: Secondary | ICD-10-CM | POA: Diagnosis not present

## 2019-11-02 DIAGNOSIS — R29898 Other symptoms and signs involving the musculoskeletal system: Secondary | ICD-10-CM | POA: Diagnosis not present

## 2019-11-02 DIAGNOSIS — N400 Enlarged prostate without lower urinary tract symptoms: Secondary | ICD-10-CM | POA: Diagnosis not present

## 2019-11-02 DIAGNOSIS — M810 Age-related osteoporosis without current pathological fracture: Secondary | ICD-10-CM | POA: Diagnosis not present

## 2019-11-02 DIAGNOSIS — M6281 Muscle weakness (generalized): Secondary | ICD-10-CM | POA: Diagnosis not present

## 2019-11-02 DIAGNOSIS — R5381 Other malaise: Secondary | ICD-10-CM | POA: Diagnosis not present

## 2019-11-02 DIAGNOSIS — M1710 Unilateral primary osteoarthritis, unspecified knee: Secondary | ICD-10-CM | POA: Diagnosis not present

## 2019-11-02 DIAGNOSIS — R21 Rash and other nonspecific skin eruption: Secondary | ICD-10-CM | POA: Diagnosis not present

## 2019-11-02 DIAGNOSIS — M170 Bilateral primary osteoarthritis of knee: Secondary | ICD-10-CM | POA: Diagnosis not present

## 2019-11-02 DIAGNOSIS — Z9181 History of falling: Secondary | ICD-10-CM | POA: Diagnosis not present

## 2019-11-02 DIAGNOSIS — I6389 Other cerebral infarction: Secondary | ICD-10-CM | POA: Diagnosis not present

## 2019-11-02 LAB — CBC WITH DIFFERENTIAL/PLATELET
Abs Immature Granulocytes: 0.02 10*3/uL (ref 0.00–0.07)
Basophils Absolute: 0 10*3/uL (ref 0.0–0.1)
Basophils Relative: 0 %
Eosinophils Absolute: 0.2 10*3/uL (ref 0.0–0.5)
Eosinophils Relative: 3 %
HCT: 44.6 % (ref 39.0–52.0)
Hemoglobin: 15 g/dL (ref 13.0–17.0)
Immature Granulocytes: 0 %
Lymphocytes Relative: 37 %
Lymphs Abs: 2.6 10*3/uL (ref 0.7–4.0)
MCH: 30.2 pg (ref 26.0–34.0)
MCHC: 33.6 g/dL (ref 30.0–36.0)
MCV: 89.7 fL (ref 80.0–100.0)
Monocytes Absolute: 0.6 10*3/uL (ref 0.1–1.0)
Monocytes Relative: 8 %
Neutro Abs: 3.7 10*3/uL (ref 1.7–7.7)
Neutrophils Relative %: 52 %
Platelets: 185 10*3/uL (ref 150–400)
RBC: 4.97 MIL/uL (ref 4.22–5.81)
RDW: 13.6 % (ref 11.5–15.5)
WBC: 7.1 10*3/uL (ref 4.0–10.5)
nRBC: 0 % (ref 0.0–0.2)

## 2019-11-02 LAB — BASIC METABOLIC PANEL
Anion gap: 8 (ref 5–15)
BUN: 16 mg/dL (ref 8–23)
CO2: 25 mmol/L (ref 22–32)
Calcium: 8.5 mg/dL — ABNORMAL LOW (ref 8.9–10.3)
Chloride: 103 mmol/L (ref 98–111)
Creatinine, Ser: 0.62 mg/dL (ref 0.61–1.24)
GFR calc Af Amer: >60 mL/min (ref >60)
GFR calc non Af Amer: >60 mL/min (ref >60)
Glucose, Bld: 107 mg/dL — ABNORMAL HIGH (ref 70–99)
Potassium: 4.7 mmol/L (ref 3.5–5.1)
Sodium: 136 mmol/L (ref 135–145)

## 2019-11-02 MED ORDER — ASPIRIN EC 81 MG PO TBEC: 81.0000 mg | DELAYED_RELEASE_TABLET | Freq: Every day | ORAL | Status: DC

## 2019-11-02 MED ORDER — ATORVASTATIN CALCIUM 40 MG PO TABS: 40.0000 mg | ORAL_TABLET | Freq: Every day | ORAL | Status: DC

## 2019-11-02 MED ORDER — CLOPIDOGREL BISULFATE 75 MG PO TABS: 75.0000 mg | ORAL_TABLET | Freq: Every day | ORAL | Status: AC

## 2019-11-02 MED ORDER — CLOPIDOGREL BISULFATE 75 MG PO TABS
75.0000 mg | ORAL_TABLET | Freq: Every day | ORAL | Status: DC
  Administered 2019-11-02: 14:00:00 75 mg via ORAL
  Filled 2019-11-02: qty 1

## 2019-11-02 MED ORDER — ATORVASTATIN CALCIUM 40 MG PO TABS: 40.0000 mg | ORAL_TABLET | Freq: Every day | ORAL | Status: AC

## 2019-11-02 MED ORDER — ASPIRIN 81 MG PO TABS: 81.0000 mg | ORAL_TABLET | Freq: Every day | ORAL | Status: AC

## 2019-11-02 NOTE — Discharge Summary (Signed)
Discharge Summary  Steven [name] [id number] DOB: 1931-02-25  PCP: Mayra Neer, MD  Admit date: 10/30/2019 Discharge date: 11/02/2019  Time spent: 40 mins  Recommendations for Outpatient Follow-up:  1. PCP in 1 week for follow-up with repeat labs 2. Neurology in 4 weeks for follow-up  Discharge Diagnoses:  Active Hospital Problems   Diagnosis Date Noted  . Arthritis of knee, bilateral 10/31/2019  . Lower extremity weakness 10/31/2019  . Weakness of left lower extremity 10/30/2019  . BPH (benign prostatic hyperplasia)   . Hyperlipemia   . GERD (gastroesophageal reflux disease) 05/20/2018    Resolved Hospital Problems  No resolved problems to display.    Discharge Condition: Stable  Diet recommendation: Heart healthy  Vitals:   11/01/19 2149 11/02/19 0606  BP: 118/89 137/86  Pulse: 82 86  Resp: 18 18  Temp: 98.5 F (36.9 C) 97.7 F (36.5 C)  SpO2: 94% 95%    History of present illness: Steven Nazario Coyleis a 85 y.o.malewith medical history significant ofGERD, BPH, hyperlipidemia, osteoporosis, osteoarthritis of the kneeswith history of vertebral fracture who is coming to the emergency department due tobilateral knee pain and LLE weakness. He has being unable to stand up since yesterday evening at 2100. He denies headache, blurred vision, slurred speech, nausea or emesis, but states that his gait is unsteady. He normally uses a walker at home. In the ED, VSS, Lumbar spine radiographs shows previous findings, but no acute abnormalities. CT head did not show any acute intracranial normality. Pt admitted for further management.    Today, patient still reports left lower extremity weakness, denies any other new complaints.  Denies any headaches, chest pain, shortness of breath, abdominal pain, nausea/vomiting, fever/chills.  Patient stable to be transferred over to SNF for further rehab.     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  No evidence of hemorrhage. Moderate chronic microvascular ischemic changes Discussed with Dr. Leonel Ramsay on 11/02/2019 from neurology, recommended aspirin plus Plavix for 3 weeks, then lifelong aspirin only.  No further work-up required Start Plavix plus aspirin on 11/02/2019 Start Lipitor 40 mg daily PT and OT recommend SNF for further rehab needs Follow-up with [facility] neurology associates in about 4 weeks as an outpatient  Multilevel lumbar degenerative disc disease Analgesics as needed for pain  BPH Continue Flomax  GERD Continue Protonix         Malnutrition Type:      Malnutrition Characteristics:      Nutrition Interventions:      Estimated body mass index is 26.88 kg/m as calculated from the following:   Height as of this encounter: 5\' 8"  (1.727 m).   Weight as of this encounter: 80.2 kg.    Procedures:  None  Consultations:  Spoke with Dr. Leonel Ramsay neurology on 11/02/2019  Discharge Exam: BP 137/86 (BP Location: Right Arm)   Pulse 86   Temp 97.7 F (36.5 C) (Oral)   Resp 18   Ht 5\' 8"  (1.727  m)   Wt 80.2 kg   SpO2 95%   BMI 26.88 kg/m   General: NAD Cardiovascular: S1, S2 present Respiratory: CTA B Neurology: Strength equal in all extremities except for his left lower extremity 3/5, no other obvious focal neurologic deficits noted  Discharge Instructions You were  cared for by a hospitalist during your hospital stay. If you have any questions about your discharge medications or the care you received while you were in the hospital after you are discharged, you can call the unit and asked to speak with the hospitalist on call if the hospitalist that took care of you is not available. Once you are discharged, your primary care physician will handle any further medical issues. Please note that NO REFILLS for any discharge medications will be authorized once you are discharged, as it is imperative that you return to your primary care physician (or establish a relationship with a primary care physician if you do not have one) for your aftercare needs so that they can reassess your need for medications and monitor your lab values.  Discharge Instructions    Diet - low sodium heart healthy   Complete by: As directed    Increase activity slowly   Complete by: As directed      Allergies as of 11/02/2019      Reactions   Penicillins Anaphylaxis   Did it involve swelling of the face/tongue/throat, SOB, or low BP? Yes Did it involve sudden or severe rash/hives, skin peeling, or any reaction on the inside of your mouth or nose? No Did you need to seek medical attention at a hospital or doctor's office? Yes When did it last happen?>10y If all above answers are "NO", may proceed with cephalosporin use.      Medication List    STOP taking these medications   naproxen sodium 220 MG tablet Commonly known as: ALEVE     TAKE these medications   acetaminophen 650 MG CR tablet Commonly known as: TYLENOL Take 650 mg by mouth every 8 (eight) hours as needed for pain.   alendronate 70 MG tablet Commonly known as: FOSAMAX Take 70 mg by mouth once a week.   aspirin 81 MG tablet Take 1 tablet (81 mg total) by mouth daily. What changed: when to take this   atorvastatin 40 MG tablet Commonly known as: LIPITOR Take 1 tablet (40 mg total) by mouth daily at 6  PM.   Centrum Silver 50+Men Tabs Take 1 tablet by mouth daily.   clopidogrel 75 MG tablet Commonly known as: PLAVIX Take 1 tablet (75 mg total) by mouth daily.   esomeprazole 40 MG capsule Commonly known as: NexIUM Take 30- 60 min before your first and last meals of the day What changed:   how much to take  how to take this  when to take this  additional instructions   famotidine 20 MG tablet Commonly known as: PEPCID Take 1 tablet (20 mg total) by mouth at bedtime.   Fish Oil 1200 MG Caps Take 1,200 mg by mouth 3 (three) times daily.   loratadine 10 MG tablet Commonly known as: CLARITIN Take 10 mg by mouth daily.   tamsulosin 0.4 MG Caps capsule Commonly known as: FLOMAX Take 0.4 mg by mouth daily.      Allergies  Allergen Reactions  . Penicillins Anaphylaxis    Did it involve swelling of the face/tongue/throat, SOB, or low BP? Yes Did it involve sudden or severe rash/hives, skin peeling, or any  reaction on the inside of your mouth or nose? No Did you need to seek medical attention at a hospital or doctor's office? Yes When did it last happen?>10y If all above answers are "NO", may proceed with cephalosporin use.    Follow-up Information    Mayra Neer, MD. Schedule an appointment as soon as possible for a visit in 1 week(s).   Specialty: Family Medicine Contact information: [address] [place] [address] [phone]        [facility]. Schedule an appointment as soon as possible for a visit in 4 week(s).   Contact information: [address]     [place] [phone]           The results of significant diagnostics from this hospitalization (including imaging, microbiology, ancillary and laboratory) are listed below for reference.    Significant Diagnostic Studies: DG Lumbar Spine Complete  Result Date: 10/30/2019 CLINICAL DATA:  [place] EXAM: LUMBAR  SPINE - COMPLETE 4+ VIEW COMPARISON:  2019 FINDINGS: Lumbar vertebral body heights and alignment are similar to the prior study including compression deformity of L4 and trace anterolisthesis at L4-L5. Multilevel degenerative disc disease is present with disc space narrowing and small endplate osteophytes. There is facet hypertrophy, greater at lower lumbar levels. IMPRESSION: No new compression deformity. Similar appearance of multilevel degenerative changes. Electronically Signed   By: Macy Mis M.D.   On: 10/30/2019 21:28   CT Head Wo Contrast  Result Date: 10/30/2019 CLINICAL DATA:  Neuro deficit, unable to stand EXAM: CT HEAD WITHOUT CONTRAST TECHNIQUE: Contiguous axial images were obtained from the base of the skull through the vertex without intravenous contrast. COMPARISON:  None. FINDINGS: Brain: No evidence of acute territorial infarction, hemorrhage, hydrocephalus,extra-axial collection or mass lesion/mass effect. There is dilatation the ventricles and sulci consistent with age-related atrophy. Low-attenuation changes in the deep white matter consistent with small vessel ischemia. Vascular: No hyperdense vessel or unexpected calcification. Skull: The skull is intact. No fracture or focal lesion identified. Sinuses/Orbits: The visualized paranasal sinuses and mastoid air cells are clear. The orbits and globes intact. Other: None IMPRESSION: No acute intracranial abnormality. Findings consistent with age related atrophy and chronic small vessel ischemia Electronically Signed   By: Prudencio Pair M.D.   On: 10/30/2019 21:12   MR BRAIN WO CONTRAST  Result Date: 11/02/2019 CLINICAL DATA:  Unsteady gait, left lower extremity weakness EXAM: MRI HEAD WITHOUT CONTRAST TECHNIQUE: Multiplanar, multiecho pulse sequences of the brain and surrounding structures were obtained without intravenous contrast. COMPARISON:  None. FINDINGS: Brain: There is an approximately 9 mm area of reduced diffusion involving  the body of the caudate and adjacent corona radiata. Additional small cortical focus of right frontoparietal parasagittal reduced diffusion (paracentral lobule). There is no evidence of hemorrhage. Prominence of the ventricles and sulci reflects generalized parenchymal volume loss. Confluent areas of T2 hyperintensity in the supratentorial white matter are nonspecific but probably reflect moderate chronic microvascular ischemic changes. There is no intracranial mass or mass effect. There is no hydrocephalus or extra-axial fluid collection. Vascular: Major vessel flow voids at the skull base are preserved. Skull and upper cervical spine: Normal marrow signal is preserved. Sinuses/Orbits: Mild mucosal thickening. Bilateral lens replacements. Other: Sella is unremarkable.  Mastoid air cells are clear. IMPRESSION: Small acute infarcts involving the right caudate and adjacent white matter as well as the parasagittal right frontoparietal cortex. No evidence of hemorrhage. Moderate chronic microvascular ischemic changes. Electronically Signed  By: Macy Mis M.D.   On: 11/02/2019 09:39   ECHOCARDIOGRAM COMPLETE  Result Date: 10/31/2019   ECHOCARDIOGRAM REPORT   Patient Name:   Steven [name] Date of Exam: 10/31/2019 Medical Rec #:  [id number]      Height:       68.0 in Accession #:    [id number]     Weight:       170.0 lb Date of Birth:  1931-02-21      BSA:          1.91 m Patient Age:    56 years       BP:           139/98 mmHg Patient Gender: M              HR:           82 bpm. Exam Location:  Inpatient Procedure: 2D Echo, Cardiac Doppler and Color Doppler Indications:    TIA 435.9  History:        Patient has no prior history of Echocardiogram examinations.                 Risk Factors:Dyslipidemia and Former Smoker. GERD.  Sonographer:    Paulita Fujita RDCS Referring Phys: [id number] DAVID MANUEL [place]  1. Left ventricular ejection fraction, by visual estimation, is 60 to 65%. The left ventricle  has normal function. There is no left ventricular hypertrophy.  2. Left ventricular diastolic parameters are consistent with Grade I diastolic dysfunction (impaired relaxation).  3. The left ventricle has no regional wall motion abnormalities.  4. Global right ventricle has normal systolic function.The right ventricular size is normal. No increase in right ventricular wall thickness.  5. Left atrial size was normal.  6. Right atrial size was normal.  7. The mitral valve is normal in structure. Trivial mitral valve regurgitation. No evidence of mitral stenosis.  8. The tricuspid valve is normal in structure. Tricuspid valve regurgitation is not demonstrated.  9. The aortic valve is normal in structure. Aortic valve regurgitation is not visualized. No evidence of aortic valve sclerosis or stenosis. 10. The pulmonic valve was normal in structure. Pulmonic valve regurgitation is not visualized. 11. Normal pulmonary artery systolic pressure. 12. The inferior vena cava is normal in size with greater than 50% respiratory variability, suggesting right atrial pressure of 3 mmHg. FINDINGS  Left Ventricle: Left ventricular ejection fraction, by visual estimation, is 60 to 65%. The left ventricle has normal function. The left ventricle has no regional wall motion abnormalities. There is no left ventricular hypertrophy. Left ventricular diastolic parameters are consistent with Grade I diastolic dysfunction (impaired relaxation). Indeterminate filling pressures. Right Ventricle: The right ventricular size is normal. No increase in right ventricular wall thickness. Global RV systolic function is has normal systolic function. The tricuspid regurgitant velocity is 2.14 m/s, and with an assumed right atrial pressure  of 3 mmHg, the estimated right ventricular systolic pressure is normal at 21.3 mmHg. Left Atrium: Left atrial size was normal in size. Right Atrium: Right atrial size was normal in size Pericardium: There is no evidence  of pericardial effusion. Mitral Valve: The mitral valve is normal in structure. Trivial mitral valve regurgitation. No evidence of mitral valve stenosis by observation. Tricuspid Valve: The tricuspid valve is normal in structure. Tricuspid valve regurgitation is not demonstrated. Aortic Valve: The aortic valve is normal in structure. Aortic valve regurgitation is not visualized. The aortic valve is structurally normal, with no evidence of  sclerosis or stenosis. Pulmonic Valve: The pulmonic valve was normal in structure. Pulmonic valve regurgitation is not visualized. Pulmonic regurgitation is not visualized. Aorta: The aortic root, ascending aorta and aortic arch are all structurally normal, with no evidence of dilitation or obstruction. Venous: The inferior vena cava is normal in size with greater than 50% respiratory variability, suggesting right atrial pressure of 3 mmHg. IAS/Shunts: No atrial level shunt detected by color flow Doppler. There is no evidence of a patent foramen ovale. No ventricular septal defect is seen or detected. There is no evidence of an atrial septal defect.  LEFT VENTRICLE PLAX 2D LVIDd:         4.50 cm  Diastology LVIDs:         3.50 cm  LV e' lateral:   6.31 cm/s LV PW:         0.70 cm  LV E/e' lateral: 10.3 LV IVS:        0.70 cm  LV e' medial:    4.57 cm/s LVOT diam:     2.00 cm  LV E/e' medial:  14.2 LV SV:         42 ml LV SV Index:   21.48 LVOT Area:     3.14 cm  RIGHT VENTRICLE RV S prime:     10.10 cm/s TAPSE (M-mode): 1.9 cm LEFT ATRIUM             Index       RIGHT ATRIUM           Index LA diam:        2.20 cm 1.15 cm/m  RA Area:     11.10 cm LA Vol (A2C):   28.8 ml 15.10 ml/m RA Volume:   19.40 ml  10.17 ml/m LA Vol (A4C):   26.4 ml 13.84 ml/m LA Biplane Vol: 29.1 ml 15.26 ml/m  AORTIC VALVE LVOT Vmax:   93.30 cm/s LVOT Vmean:  64.900 cm/s LVOT VTI:    0.200 m  AORTA Ao Root diam: 3.70 cm MITRAL VALVE                        TRICUSPID VALVE MV Area (PHT): 2.46 cm              TR Peak grad:   18.3 mmHg MV PHT:        89.61 msec           TR Vmax:        214.00 cm/s MV Decel Time: 309 msec MV E velocity: 65.10 cm/s 103 cm/s  SHUNTS MV A velocity: 96.80 cm/s 70.3 cm/s Systemic VTI:  0.20 m MV E/A ratio:  0.67       1.5       Systemic Diam: 2.00 cm  Dani Gobble Croitoru MD Electronically signed by Sanda Klein MD Signature Date/Time: 10/31/2019/1:52:19 PM    Final    VAS US CAROTID (at [facility] and [facility] only)  Result Date: 10/31/2019 Carotid Arterial Duplex Study Indications:       Weakness. Limitations        Today's exam was limited due to the patient's respiratory                    variation. Comparison Study:  No prior studies. Performing Technologist: Oliver Hum RVT  Examination Guidelines: A complete evaluation includes B-mode imaging, spectral Doppler, color Doppler, and power Doppler as needed of all accessible portions of each vessel. Bilateral  testing is considered an integral part of a complete examination. Limited examinations for reoccurring indications may be performed as noted.  Right Carotid Findings: +----------+--------+--------+--------+-----------------------+--------+           PSV cm/sEDV cm/sStenosisPlaque Description     Comments +----------+--------+--------+--------+-----------------------+--------+ CCA Prox  77      18              smooth and heterogenoustortuous +----------+--------+--------+--------+-----------------------+--------+ CCA Distal34      7               smooth and heterogenous         +----------+--------+--------+--------+-----------------------+--------+ ICA Prox  41      14              smooth and heterogenoustortuous +----------+--------+--------+--------+-----------------------+--------+ ICA Distal62      22                                     tortuous +----------+--------+--------+--------+-----------------------+--------+ ECA       46      10                                               +----------+--------+--------+--------+-----------------------+--------+ +----------+--------+-------+--------+-------------------+           PSV cm/sEDV cmsDescribeArm Pressure (mmHG) +----------+--------+-------+--------+-------------------+ [id number]                                         +----------+--------+-------+--------+-------------------+ +---------+--------+--+--------+--+---------+ VertebralPSV cm/s45EDV cm/s15Antegrade +---------+--------+--+--------+--+---------+  Left Carotid Findings: +----------+--------+--------+--------+-----------------------+--------+           PSV cm/sEDV cm/sStenosisPlaque Description     Comments +----------+--------+--------+--------+-----------------------+--------+ CCA Prox  76      17              smooth and heterogenoustortuous +----------+--------+--------+--------+-----------------------+--------+ CCA Distal45      11              smooth and heterogenous         +----------+--------+--------+--------+-----------------------+--------+ ICA Prox  30      6               smooth and heterogenoustortuous +----------+--------+--------+--------+-----------------------+--------+ ICA Distal54      21                                     tortuous +----------+--------+--------+--------+-----------------------+--------+ ECA       25      5                                               +----------+--------+--------+--------+-----------------------+--------+ +----------+--------+--------+--------+-------------------+           PSV cm/sEDV cm/sDescribeArm Pressure (mmHG) +----------+--------+--------+--------+-------------------+ Subclavian132                                         +----------+--------+--------+--------+-------------------+ +---------+--------+--+--------+-+---------+ VertebralPSV cm/s29EDV cm/s9Antegrade +---------+--------+--+--------+-+---------+  Summary: Right Carotid: Velocities  in the right ICA are consistent  with a 1-39% stenosis. Left Carotid: Velocities in the left ICA are consistent with a 1-39% stenosis. Vertebrals: Bilateral vertebral arteries demonstrate antegrade flow. *See table(s) above for measurements and observations.  Electronically signed by Deitra Mayo MD on 10/31/2019 at 5:23:54 PM.    Final     Microbiology: Recent Results (from the past 240 hour(s))  SARS CORONAVIRUS 2 (TAT 6-24 HRS) Nasopharyngeal Nasopharyngeal Swab     Status: None   Collection Time: 10/30/19 10:13 PM   Specimen: Nasopharyngeal Swab  Result Value Ref Range Status   SARS Coronavirus 2 NEGATIVE NEGATIVE Final    Comment: (NOTE) SARS-CoV-2 target nucleic acids are NOT DETECTED. The SARS-CoV-2 RNA is generally detectable in upper and lower respiratory specimens during the acute phase of infection. Negative results do not preclude SARS-CoV-2 infection, do not rule out co-infections with other pathogens, and should not be used as the sole basis for treatment or other patient management decisions. Negative results must be combined with clinical observations, patient history, and epidemiological information. The expected result is Negative. Fact Sheet for Patients: SugarRoll.be Fact Sheet for Healthcare Providers: https://www.woods-mathews.com/ This test is not yet approved or cleared by the [place] FDA and  has been authorized for detection and/or diagnosis of SARS-CoV-2 by FDA under an Emergency Use Authorization (EUA). This EUA will remain  in effect (meaning this test can be used) for the duration of the COVID-19 declaration under Section 56 4(b)(1) of the Act, 21 U.S.C. section 360bbb-3(b)(1), unless the authorization is terminated or revoked sooner. Performed at [facility], [place] [address], [place], [place] [address]      Labs: Basic Metabolic Panel: Recent Labs  Lab 10/30/19 2050 11/02/19 0410  NA 139  136  K 3.7 4.7  CL 103 103  CO2 26 25  GLUCOSE 107* 107*  BUN 13 16  CREATININE 0.54* 0.62  CALCIUM 9.0 8.5*   Liver Function Tests: Recent Labs  Lab 10/30/19 2050  AST 25  ALT 26  ALKPHOS 55  BILITOT 0.8  PROT 7.4  ALBUMIN 4.2   No results for input(s): LIPASE, AMYLASE in the last 168 hours. No results for input(s): AMMONIA in the last 168 hours. CBC: Recent Labs  Lab 10/30/19 2050 11/02/19 0410  WBC 8.1 7.1  NEUTROABS  --  3.7  HGB 15.5 15.0  HCT 47.4 44.6  MCV 90.8 89.7  PLT 223 185   Cardiac Enzymes: No results for input(s): CKTOTAL, CKMB, CKMBINDEX, TROPONINI in the last 168 hours. BNP: BNP (last 3 results) No results for input(s): BNP in the last 8760 hours.  ProBNP (last 3 results) No results for input(s): PROBNP in the last 8760 hours.  CBG: No results for input(s): GLUCAP in the last 168 hours.     Signed:  Alma Friendly, MD [facility] 11/02/2019, 12:59 PM

## 2019-11-02 NOTE — Care Management Important Message (Signed)
Important Message  Patient Details IM Letter given to [facility] Case Manager to present to the Patient                                                     Name: Steven [name] MRN: [id number] Date of Birth: 1931-09-10   Medicare Important Message Given:  Yes     Kerin Salen 11/02/2019, 11:36 AM

## 2019-11-02 NOTE — TOC Progression Note (Signed)
Transition of Care [facility]) - Progression Note    Patient Details  Name: Steven [name] MRN: [id number] Date of Birth: 04/13/1931  Transition of Care [facility]) CM/SW Contact  Purcell Mouton, RN Phone Number: 11/02/2019, 2:00 PM  Clinical Narrative:    Admission's rep at [facility] was called. Pt may return, clinicals faxed. [facility] called at 1:53 PM and will arrive one hours and 30 mins to pick pt up. RN is aware.   Expected Discharge Plan: [place] Barriers to Discharge: No Barriers Identified  Expected Discharge Plan and Services Expected Discharge Plan: [facility] In-house Referral: Clinical Social Work Discharge Planning Services: CM Consult Post Acute Care Choice: [place] arrangements for the past 2 months: Single Family Home Expected Discharge Date: 11/02/19                                     Social Determinants of Health (SDOH) Interventions    Readmission Risk Interventions No flowsheet data found.

## 2019-11-06 DIAGNOSIS — N400 Enlarged prostate without lower urinary tract symptoms: Secondary | ICD-10-CM | POA: Diagnosis not present

## 2019-11-06 DIAGNOSIS — K219 Gastro-esophageal reflux disease without esophagitis: Secondary | ICD-10-CM | POA: Diagnosis not present

## 2019-11-06 DIAGNOSIS — I639 Cerebral infarction, unspecified: Secondary | ICD-10-CM | POA: Diagnosis not present

## 2019-11-06 DIAGNOSIS — M810 Age-related osteoporosis without current pathological fracture: Secondary | ICD-10-CM | POA: Diagnosis not present

## 2019-11-17 DIAGNOSIS — N400 Enlarged prostate without lower urinary tract symptoms: Secondary | ICD-10-CM | POA: Diagnosis not present

## 2019-11-17 DIAGNOSIS — J302 Other seasonal allergic rhinitis: Secondary | ICD-10-CM | POA: Diagnosis not present

## 2019-11-17 DIAGNOSIS — Z8673 Personal history of transient ischemic attack (TIA), and cerebral infarction without residual deficits: Secondary | ICD-10-CM | POA: Diagnosis not present

## 2019-11-17 DIAGNOSIS — M810 Age-related osteoporosis without current pathological fracture: Secondary | ICD-10-CM | POA: Diagnosis not present

## 2019-11-17 DIAGNOSIS — K219 Gastro-esophageal reflux disease without esophagitis: Secondary | ICD-10-CM | POA: Diagnosis not present

## 2019-11-17 DIAGNOSIS — M1990 Unspecified osteoarthritis, unspecified site: Secondary | ICD-10-CM | POA: Diagnosis not present

## 2019-12-05 DIAGNOSIS — R21 Rash and other nonspecific skin eruption: Secondary | ICD-10-CM | POA: Diagnosis not present

## 2019-12-20 ENCOUNTER — Ambulatory Visit: Payer: Medicare HMO | Admitting: Podiatry

## 2020-01-01 DIAGNOSIS — I679 Cerebrovascular disease, unspecified: Secondary | ICD-10-CM | POA: Diagnosis not present

## 2020-01-01 DIAGNOSIS — M8949 Other hypertrophic osteoarthropathy, multiple sites: Secondary | ICD-10-CM | POA: Diagnosis not present

## 2020-01-01 DIAGNOSIS — M810 Age-related osteoporosis without current pathological fracture: Secondary | ICD-10-CM | POA: Diagnosis not present

## 2020-01-01 DIAGNOSIS — K219 Gastro-esophageal reflux disease without esophagitis: Secondary | ICD-10-CM | POA: Diagnosis not present

## 2020-01-09 DIAGNOSIS — E559 Vitamin D deficiency, unspecified: Secondary | ICD-10-CM | POA: Diagnosis not present

## 2020-01-09 DIAGNOSIS — D649 Anemia, unspecified: Secondary | ICD-10-CM | POA: Diagnosis not present

## 2020-01-11 DIAGNOSIS — M1990 Unspecified osteoarthritis, unspecified site: Secondary | ICD-10-CM | POA: Diagnosis not present

## 2020-01-11 DIAGNOSIS — N400 Enlarged prostate without lower urinary tract symptoms: Secondary | ICD-10-CM | POA: Diagnosis not present

## 2020-01-11 DIAGNOSIS — Z8673 Personal history of transient ischemic attack (TIA), and cerebral infarction without residual deficits: Secondary | ICD-10-CM | POA: Diagnosis not present

## 2020-01-11 DIAGNOSIS — J302 Other seasonal allergic rhinitis: Secondary | ICD-10-CM | POA: Diagnosis not present

## 2020-01-11 DIAGNOSIS — K219 Gastro-esophageal reflux disease without esophagitis: Secondary | ICD-10-CM | POA: Diagnosis not present

## 2020-01-11 DIAGNOSIS — R5381 Other malaise: Secondary | ICD-10-CM | POA: Diagnosis not present

## 2020-02-28 DIAGNOSIS — E785 Hyperlipidemia, unspecified: Secondary | ICD-10-CM | POA: Diagnosis not present

## 2020-03-01 DIAGNOSIS — R5381 Other malaise: Secondary | ICD-10-CM | POA: Diagnosis not present

## 2020-03-01 DIAGNOSIS — R69 Illness, unspecified: Secondary | ICD-10-CM | POA: Diagnosis not present

## 2020-03-01 DIAGNOSIS — S91001D Unspecified open wound, right ankle, subsequent encounter: Secondary | ICD-10-CM | POA: Diagnosis not present

## 2020-03-21 DIAGNOSIS — S91001D Unspecified open wound, right ankle, subsequent encounter: Secondary | ICD-10-CM | POA: Diagnosis not present

## 2020-04-01 DIAGNOSIS — R69 Illness, unspecified: Secondary | ICD-10-CM | POA: Diagnosis not present

## 2020-04-05 DIAGNOSIS — M8949 Other hypertrophic osteoarthropathy, multiple sites: Secondary | ICD-10-CM | POA: Diagnosis not present

## 2020-04-05 DIAGNOSIS — K219 Gastro-esophageal reflux disease without esophagitis: Secondary | ICD-10-CM | POA: Diagnosis not present

## 2020-04-05 DIAGNOSIS — N400 Enlarged prostate without lower urinary tract symptoms: Secondary | ICD-10-CM | POA: Diagnosis not present

## 2020-04-05 DIAGNOSIS — I679 Cerebrovascular disease, unspecified: Secondary | ICD-10-CM | POA: Diagnosis not present

## 2020-05-06 DIAGNOSIS — R69 Illness, unspecified: Secondary | ICD-10-CM | POA: Diagnosis not present

## 2020-05-08 DIAGNOSIS — R400 Somnolence: Secondary | ICD-10-CM | POA: Diagnosis not present

## 2020-05-14 DIAGNOSIS — L84 Corns and callosities: Secondary | ICD-10-CM | POA: Diagnosis not present

## 2020-05-14 DIAGNOSIS — M79672 Pain in left foot: Secondary | ICD-10-CM | POA: Diagnosis not present

## 2020-05-15 DIAGNOSIS — M79672 Pain in left foot: Secondary | ICD-10-CM | POA: Diagnosis not present

## 2020-05-15 DIAGNOSIS — D649 Anemia, unspecified: Secondary | ICD-10-CM | POA: Diagnosis not present

## 2020-05-15 DIAGNOSIS — M159 Polyosteoarthritis, unspecified: Secondary | ICD-10-CM | POA: Diagnosis not present

## 2020-05-15 DIAGNOSIS — K219 Gastro-esophageal reflux disease without esophagitis: Secondary | ICD-10-CM | POA: Diagnosis not present

## 2020-05-15 DIAGNOSIS — I10 Essential (primary) hypertension: Secondary | ICD-10-CM | POA: Diagnosis not present

## 2020-05-15 DIAGNOSIS — Z8673 Personal history of transient ischemic attack (TIA), and cerebral infarction without residual deficits: Secondary | ICD-10-CM | POA: Diagnosis not present

## 2020-05-15 DIAGNOSIS — J302 Other seasonal allergic rhinitis: Secondary | ICD-10-CM | POA: Diagnosis not present

## 2020-05-15 DIAGNOSIS — M810 Age-related osteoporosis without current pathological fracture: Secondary | ICD-10-CM | POA: Diagnosis not present

## 2020-05-15 DIAGNOSIS — N400 Enlarged prostate without lower urinary tract symptoms: Secondary | ICD-10-CM | POA: Diagnosis not present

## 2020-05-15 DIAGNOSIS — E039 Hypothyroidism, unspecified: Secondary | ICD-10-CM | POA: Diagnosis not present

## 2020-05-15 DIAGNOSIS — E559 Vitamin D deficiency, unspecified: Secondary | ICD-10-CM | POA: Diagnosis not present

## 2020-06-07 DIAGNOSIS — N390 Urinary tract infection, site not specified: Secondary | ICD-10-CM | POA: Diagnosis not present

## 2020-06-10 DIAGNOSIS — I69844 Monoplegia of lower limb following other cerebrovascular disease affecting left non-dominant side: Secondary | ICD-10-CM | POA: Diagnosis not present

## 2020-06-10 DIAGNOSIS — M25561 Pain in right knee: Secondary | ICD-10-CM | POA: Diagnosis not present

## 2020-06-10 DIAGNOSIS — M170 Bilateral primary osteoarthritis of knee: Secondary | ICD-10-CM | POA: Diagnosis not present

## 2020-06-10 DIAGNOSIS — M6281 Muscle weakness (generalized): Secondary | ICD-10-CM | POA: Diagnosis not present

## 2020-06-10 DIAGNOSIS — R2689 Other abnormalities of gait and mobility: Secondary | ICD-10-CM | POA: Diagnosis not present

## 2020-06-12 ENCOUNTER — Ambulatory Visit: Payer: Medicare HMO | Admitting: Podiatry

## 2020-07-01 DIAGNOSIS — R69 Illness, unspecified: Secondary | ICD-10-CM | POA: Diagnosis not present

## 2020-07-19 DIAGNOSIS — Z961 Presence of intraocular lens: Secondary | ICD-10-CM | POA: Diagnosis not present

## 2020-07-19 DIAGNOSIS — H35373 Puckering of macula, bilateral: Secondary | ICD-10-CM | POA: Diagnosis not present

## 2020-07-19 DIAGNOSIS — H26493 Other secondary cataract, bilateral: Secondary | ICD-10-CM | POA: Diagnosis not present

## 2020-07-19 DIAGNOSIS — H0100A Unspecified blepharitis right eye, upper and lower eyelids: Secondary | ICD-10-CM | POA: Diagnosis not present

## 2020-08-02 DIAGNOSIS — M8949 Other hypertrophic osteoarthropathy, multiple sites: Secondary | ICD-10-CM | POA: Diagnosis not present

## 2020-08-02 DIAGNOSIS — I679 Cerebrovascular disease, unspecified: Secondary | ICD-10-CM | POA: Diagnosis not present

## 2020-08-02 DIAGNOSIS — I69354 Hemiplegia and hemiparesis following cerebral infarction affecting left non-dominant side: Secondary | ICD-10-CM | POA: Diagnosis not present

## 2020-08-02 DIAGNOSIS — N400 Enlarged prostate without lower urinary tract symptoms: Secondary | ICD-10-CM | POA: Diagnosis not present

## 2020-08-02 DIAGNOSIS — K219 Gastro-esophageal reflux disease without esophagitis: Secondary | ICD-10-CM | POA: Diagnosis not present

## 2020-08-14 DIAGNOSIS — Z23 Encounter for immunization: Secondary | ICD-10-CM | POA: Diagnosis not present

## 2020-08-20 DIAGNOSIS — D649 Anemia, unspecified: Secondary | ICD-10-CM | POA: Diagnosis not present

## 2020-08-20 DIAGNOSIS — I10 Essential (primary) hypertension: Secondary | ICD-10-CM | POA: Diagnosis not present

## 2020-09-12 DIAGNOSIS — M79672 Pain in left foot: Secondary | ICD-10-CM | POA: Diagnosis not present

## 2020-09-12 DIAGNOSIS — K219 Gastro-esophageal reflux disease without esophagitis: Secondary | ICD-10-CM | POA: Diagnosis not present

## 2020-09-12 DIAGNOSIS — N400 Enlarged prostate without lower urinary tract symptoms: Secondary | ICD-10-CM | POA: Diagnosis not present

## 2020-09-12 DIAGNOSIS — M810 Age-related osteoporosis without current pathological fracture: Secondary | ICD-10-CM | POA: Diagnosis not present

## 2020-09-12 DIAGNOSIS — M1990 Unspecified osteoarthritis, unspecified site: Secondary | ICD-10-CM | POA: Diagnosis not present

## 2020-09-12 DIAGNOSIS — Z8673 Personal history of transient ischemic attack (TIA), and cerebral infarction without residual deficits: Secondary | ICD-10-CM | POA: Diagnosis not present

## 2020-09-12 DIAGNOSIS — T7840XD Allergy, unspecified, subsequent encounter: Secondary | ICD-10-CM | POA: Diagnosis not present

## 2020-10-15 DIAGNOSIS — L821 Other seborrheic keratosis: Secondary | ICD-10-CM | POA: Diagnosis not present

## 2020-10-15 DIAGNOSIS — C44329 Squamous cell carcinoma of skin of other parts of face: Secondary | ICD-10-CM | POA: Diagnosis not present

## 2020-10-15 DIAGNOSIS — L570 Actinic keratosis: Secondary | ICD-10-CM | POA: Diagnosis not present

## 2020-10-15 DIAGNOSIS — D485 Neoplasm of uncertain behavior of skin: Secondary | ICD-10-CM | POA: Diagnosis not present

## 2020-10-15 DIAGNOSIS — L814 Other melanin hyperpigmentation: Secondary | ICD-10-CM | POA: Diagnosis not present

## 2020-11-12 DIAGNOSIS — H2513 Age-related nuclear cataract, bilateral: Secondary | ICD-10-CM | POA: Diagnosis not present

## 2020-11-12 DIAGNOSIS — L821 Other seborrheic keratosis: Secondary | ICD-10-CM | POA: Diagnosis not present

## 2020-11-12 DIAGNOSIS — L820 Inflamed seborrheic keratosis: Secondary | ICD-10-CM | POA: Diagnosis not present

## 2020-11-12 DIAGNOSIS — L570 Actinic keratosis: Secondary | ICD-10-CM | POA: Diagnosis not present

## 2020-11-12 DIAGNOSIS — C44329 Squamous cell carcinoma of skin of other parts of face: Secondary | ICD-10-CM | POA: Diagnosis not present

## 2020-11-12 DIAGNOSIS — L814 Other melanin hyperpigmentation: Secondary | ICD-10-CM | POA: Diagnosis not present

## 2020-11-27 DIAGNOSIS — D485 Neoplasm of uncertain behavior of skin: Secondary | ICD-10-CM | POA: Diagnosis not present

## 2020-11-27 DIAGNOSIS — L821 Other seborrheic keratosis: Secondary | ICD-10-CM | POA: Diagnosis not present

## 2020-12-10 DIAGNOSIS — C44329 Squamous cell carcinoma of skin of other parts of face: Secondary | ICD-10-CM | POA: Diagnosis not present

## 2020-12-10 DIAGNOSIS — L814 Other melanin hyperpigmentation: Secondary | ICD-10-CM | POA: Diagnosis not present

## 2020-12-10 DIAGNOSIS — L570 Actinic keratosis: Secondary | ICD-10-CM | POA: Diagnosis not present

## 2020-12-13 IMAGING — CT CT HEAD W/O CM
1 series · 12 of 32 positions shown, 15 images · non-contrast
Comparison: None.

CLINICAL DATA: Neuro deficit, unable to stand

EXAM:
CT HEAD WITHOUT CONTRAST
TECHNIQUE: Contiguous axial images were obtained from the base of the skull
through the vertex without intravenous contrast.

[Series 6: sagittal soft tissue · sagittal · 0.32mm/px · 12 of 58 slices shown, 15 images]
[im 4/58  brain]
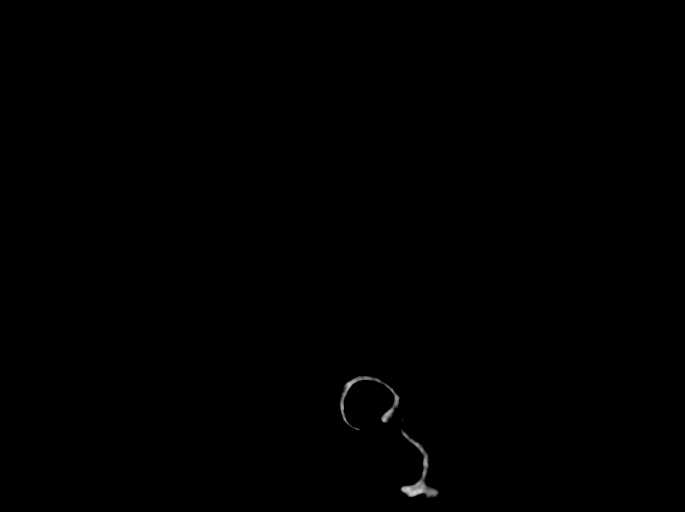
[im 4/58  bone]
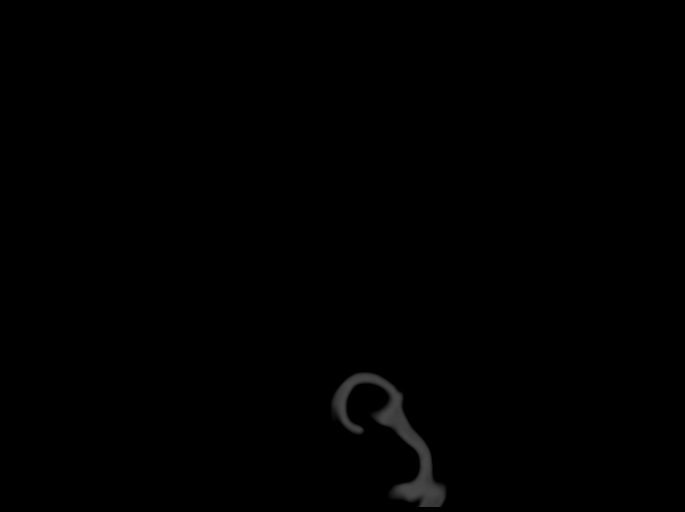
[im 8/58  brain]
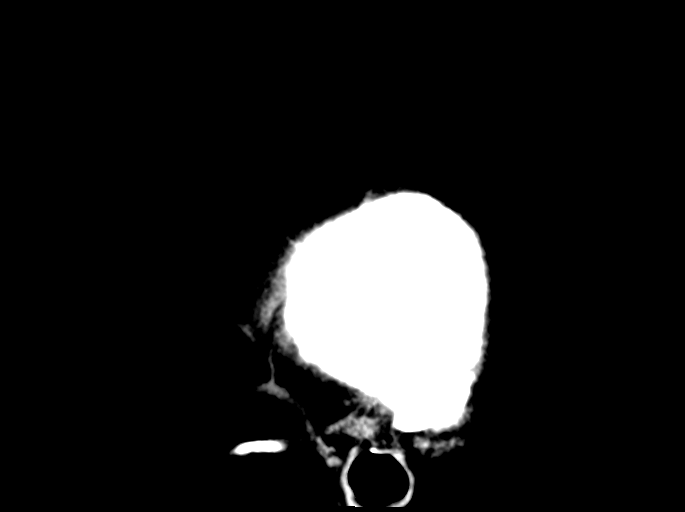
[im 14/58  brain]
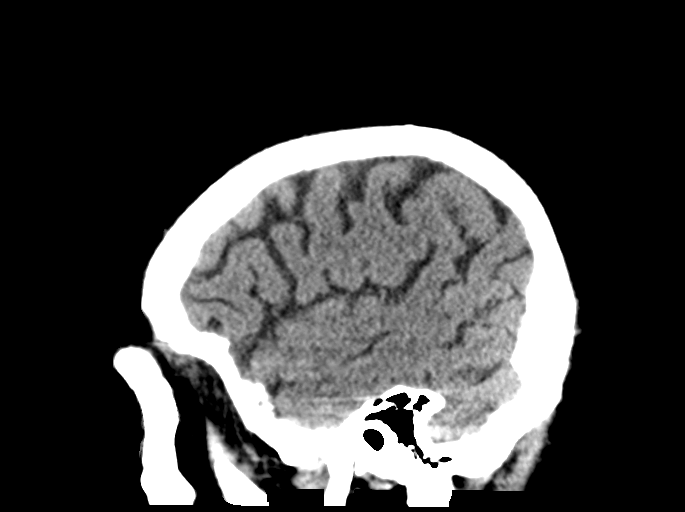
[im 18/58  brain]
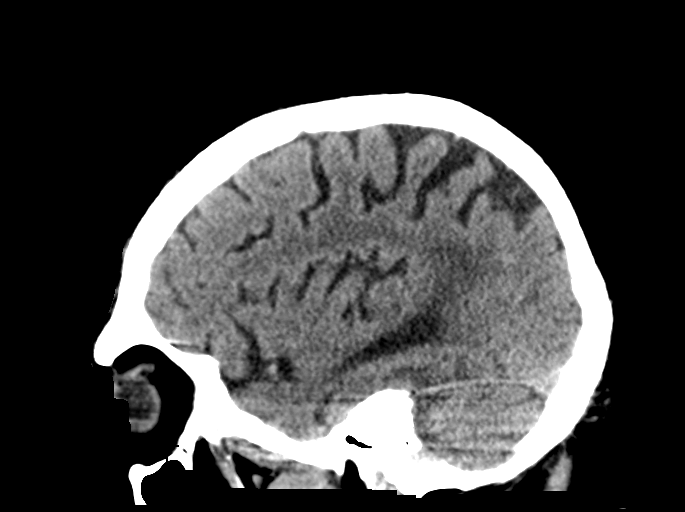
[im 24/58  brain]
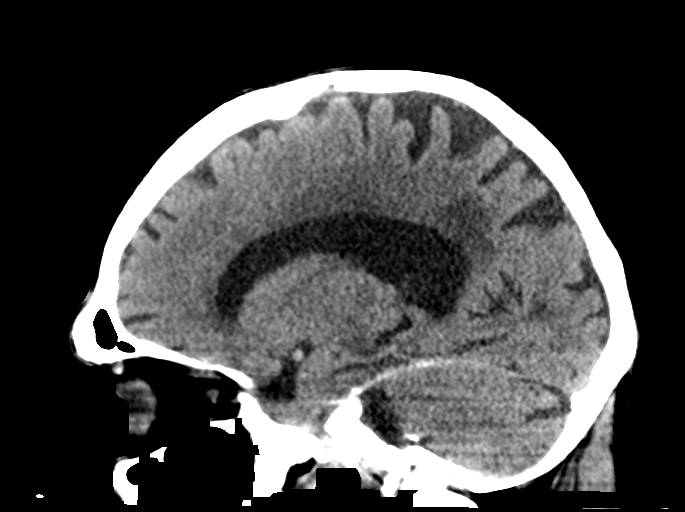
[im 24/58  bone]
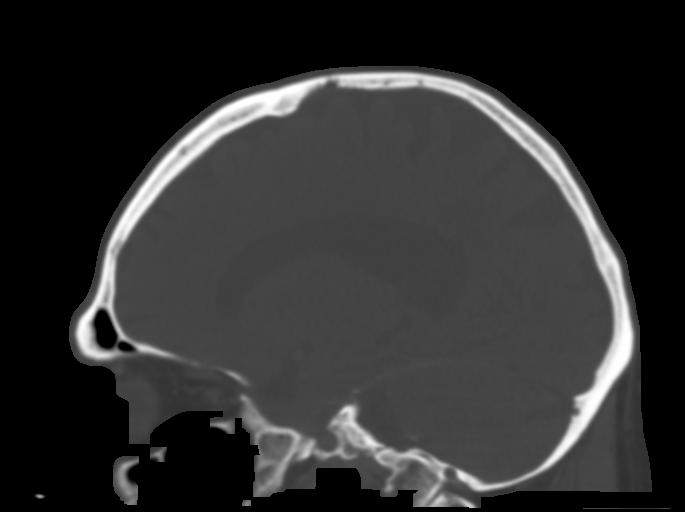
[im 28/58  brain]
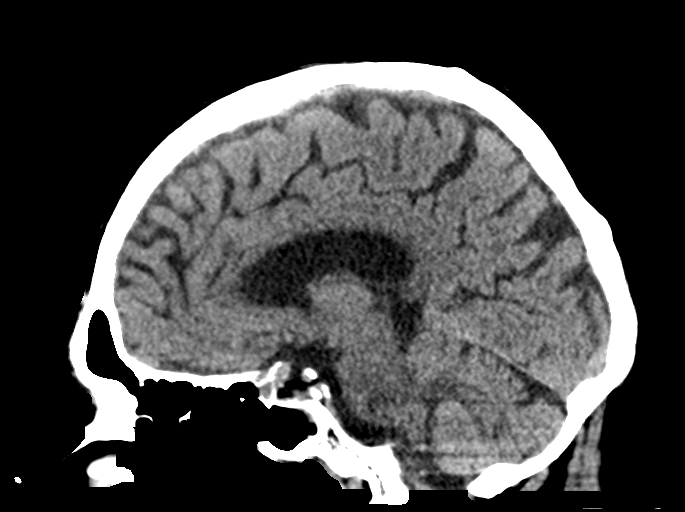
[im 32/58  brain]
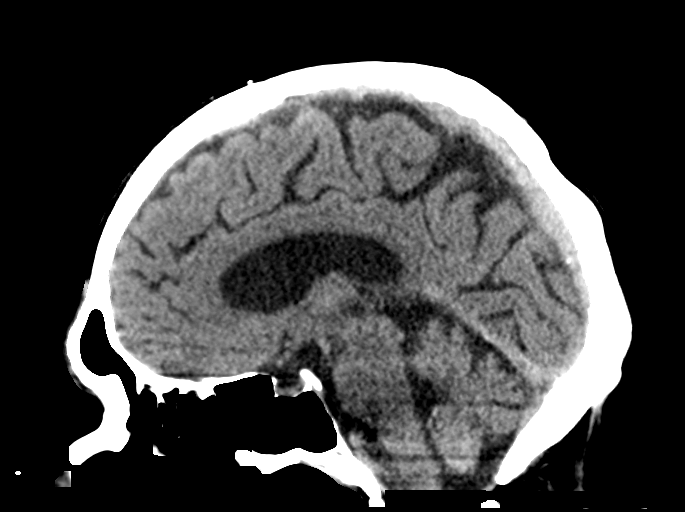
[im 36/58  brain]
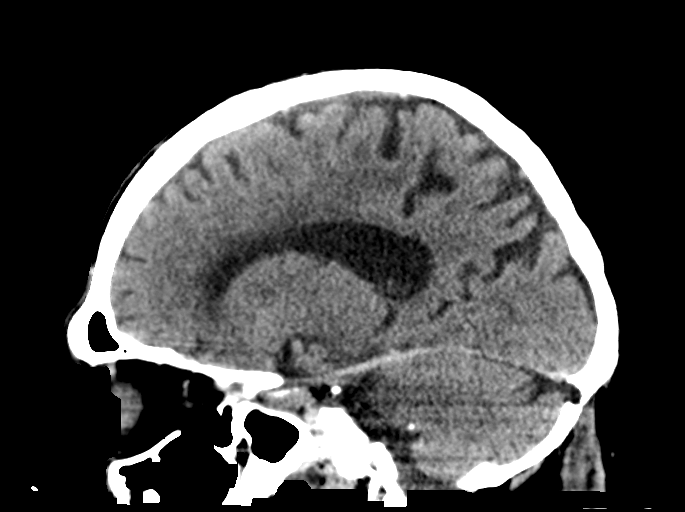
[im 40/58  brain]
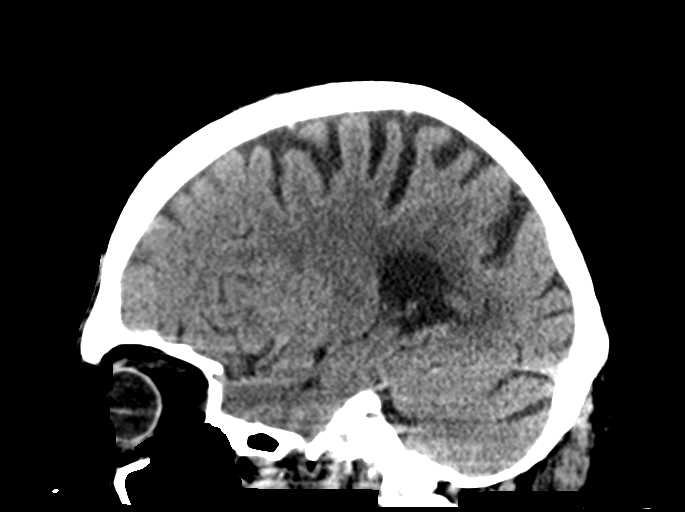
[im 40/58  bone]
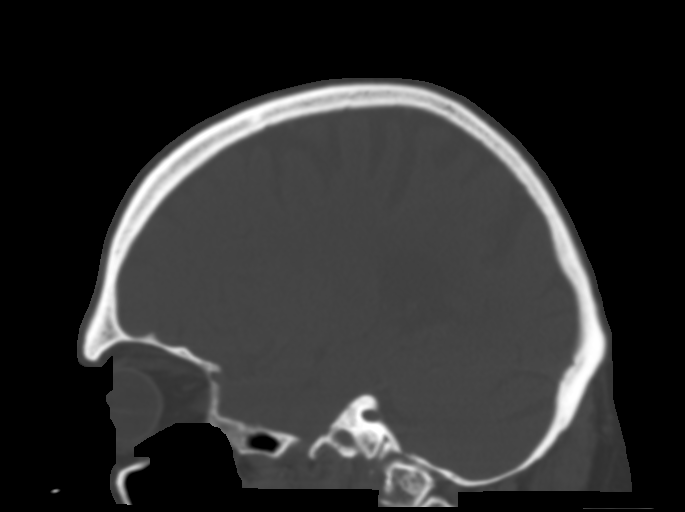
[im 44/58  brain]
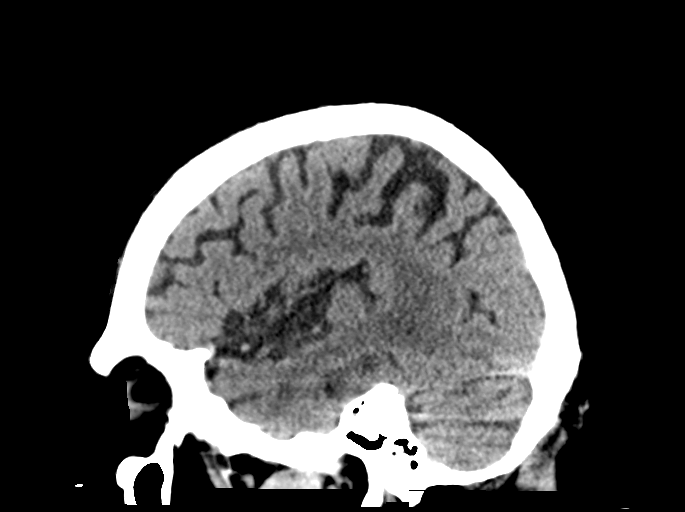
[im 50/58  brain]
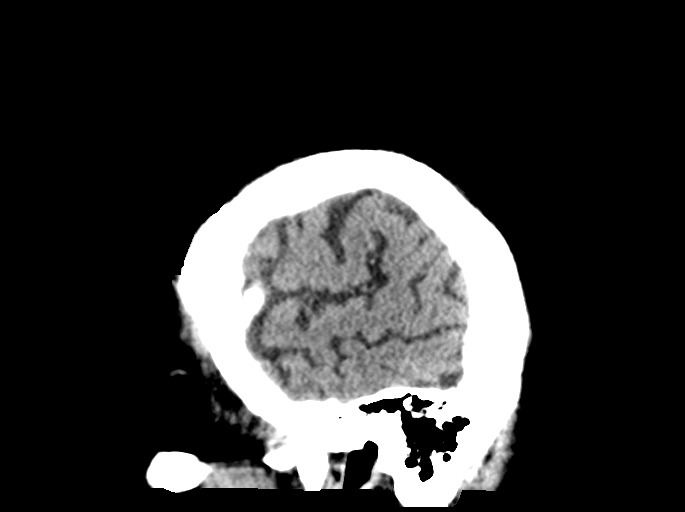
[im 54/58  brain]
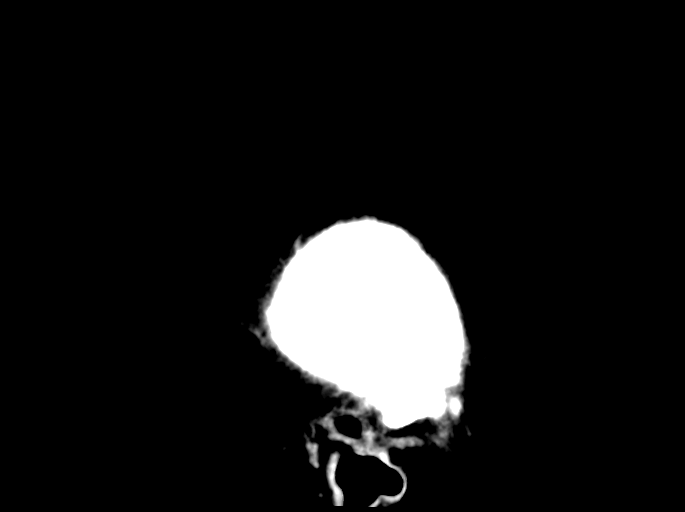

[12 of 32 positions shown; findings below may reference images not displayed]

FINDINGS: Brain: No evidence of acute territorial infarction, hemorrhage,
hydrocephalus,extra-axial collection or mass lesion/mass effect.
There is dilatation the ventricles and sulci consistent with
age-related atrophy. Low-attenuation changes in the deep white
matter consistent with small vessel ischemia.

Vascular: No hyperdense vessel or unexpected calcification.

Skull: The skull is intact. No fracture or focal lesion identified.

Sinuses/Orbits: The visualized paranasal sinuses and mastoid air
cells are clear. The orbits and globes intact.

Other: None
IMPRESSION: No acute intracranial abnormality.

Findings consistent with age related atrophy and chronic small
vessel ischemia

## 2020-12-16 IMAGING — MR MR HEAD W/O CM
9 of 10 series · 38 of 48 positions shown · non-contrast
Comparison: None.

CLINICAL DATA: Unsteady gait, left lower extremity weakness

EXAM:
MRI HEAD WITHOUT CONTRAST
TECHNIQUE: Multiplanar, multiecho pulse sequences of the brain and surrounding
structures were obtained without intravenous contrast.

[Series 8: DWI · axial · 3.0mm · 1.09mm/px · z∈[-47,+101]mm · 8 of 102 slices shown (1 of 4)]
[im 1/102]
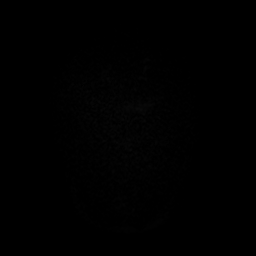
[im 15/102]
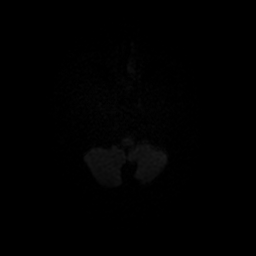
[im 29/102]
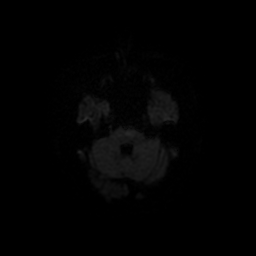
[im 44/102]
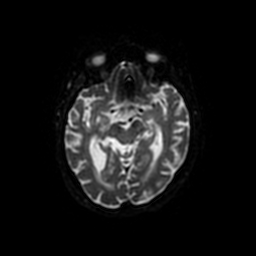
[im 58/102]
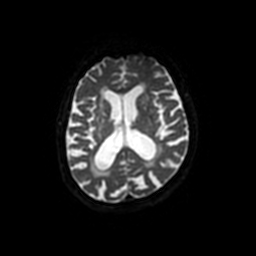
[im 73/102]
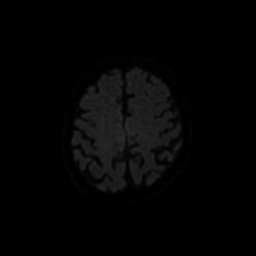
[im 87/102]
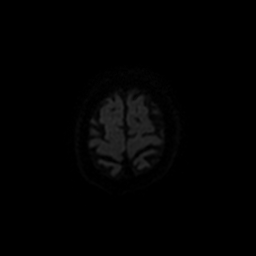
[im 102/102]
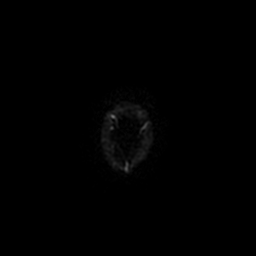

[Series 9: T1 · sagittal · 5.0mm · 0.47mm/px · 2 of 20 slices shown (1 of 2)]
[im 1/20]
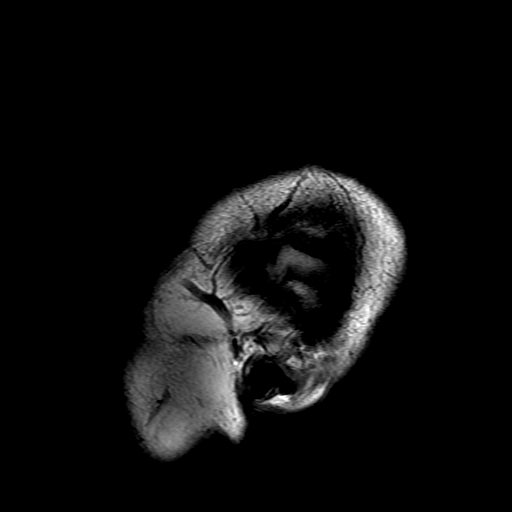
[im 20/20]
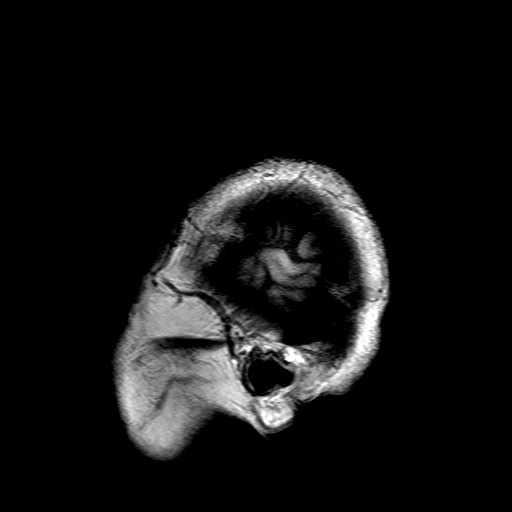

[Series 10: T2 · axial · 5.0mm · 0.43mm/px · z∈[-63,+86]mm · 2 of 26 slices shown (1 of 2)]
[im 1/26]
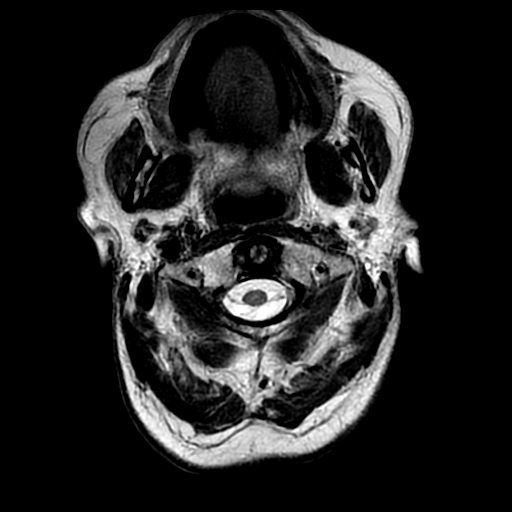
[im 26/26]
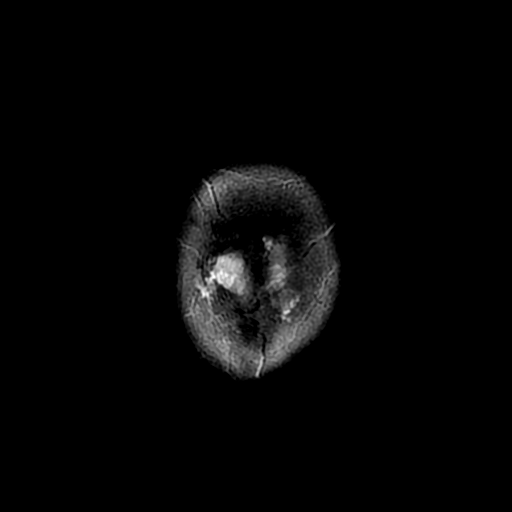

[Series 11: FLAIR · axial · 3.0mm · 0.43mm/px · z∈[-56,+92]mm · 2 of 26 slices shown]
[im 1/26]
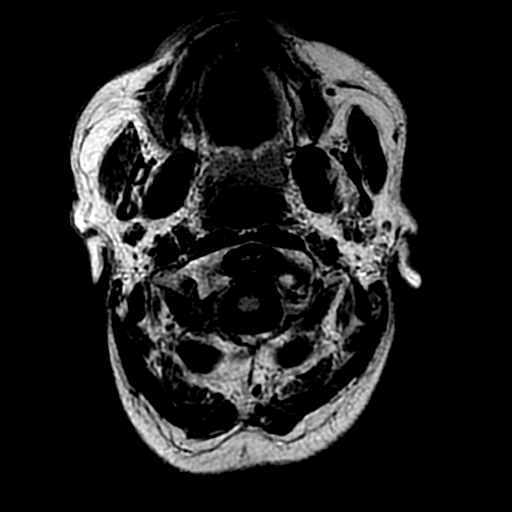
[im 26/26]
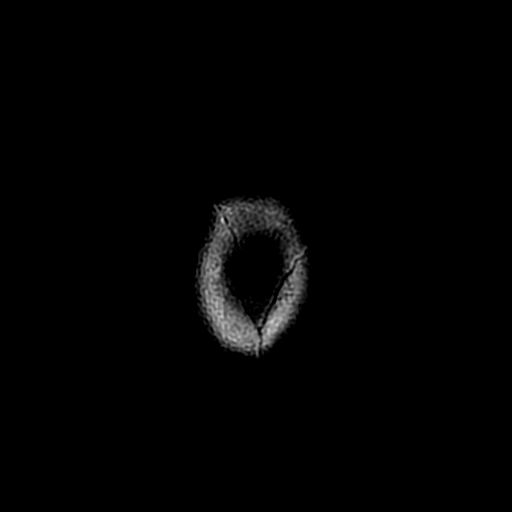

[Series 12: DWI · coronal · 2.0mm · 1.09mm/px · 9 of 140 slices shown (2 of 4)]
[im 1/140]
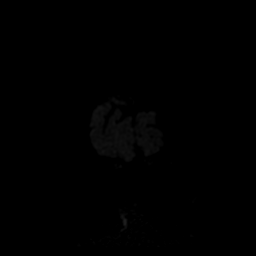
[im 26/140]
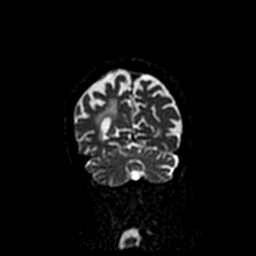
[im 38/140]
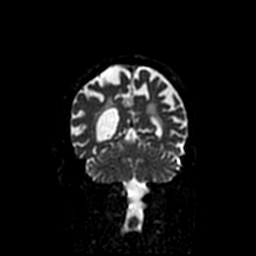
[im 64/140]
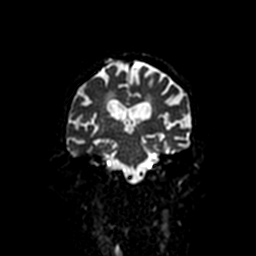
[im 76/140]
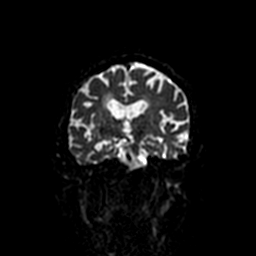
[im 102/140]
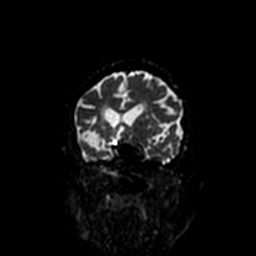
[im 114/140]
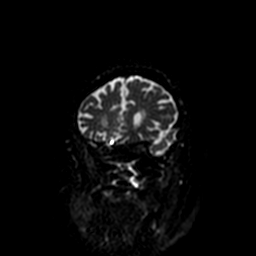
[im 127/140]
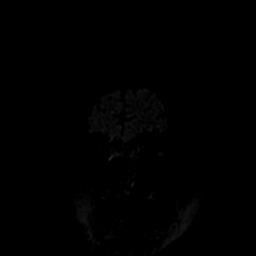
[im 140/140]
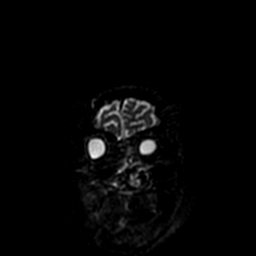

[Series 14: T1 · axial · 1.8mm · 0.47mm/px · z∈[-45,+3]mm · 3 of 82 slices shown (2 of 2)]
[im 1/82]
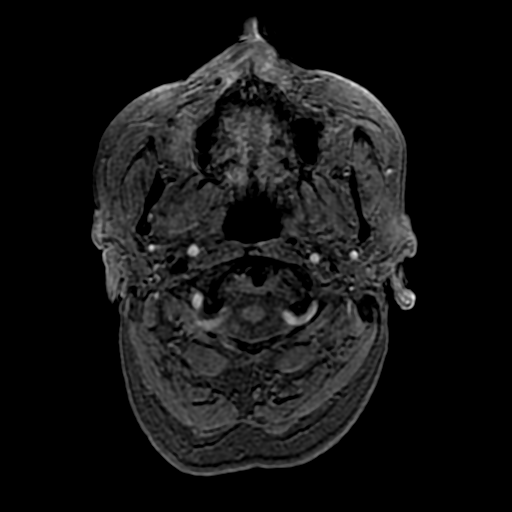
[im 14/82]
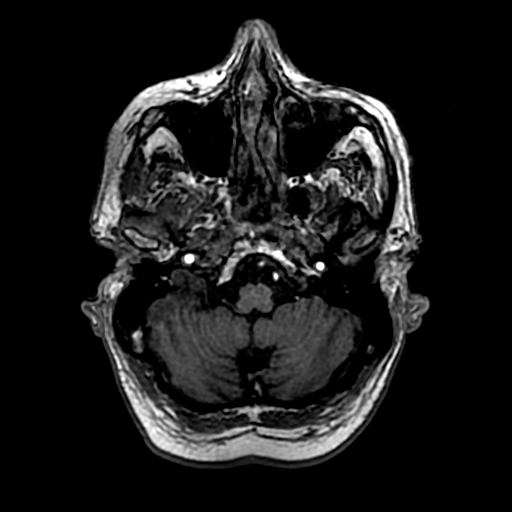
[im 28/82]
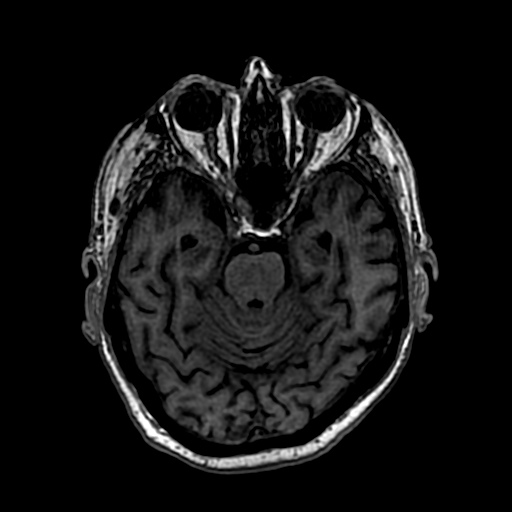

[Series 15: T2 · coronal · 5.0mm · 0.47mm/px · 2 of 24 slices shown (2 of 2)]
[im 1/24]
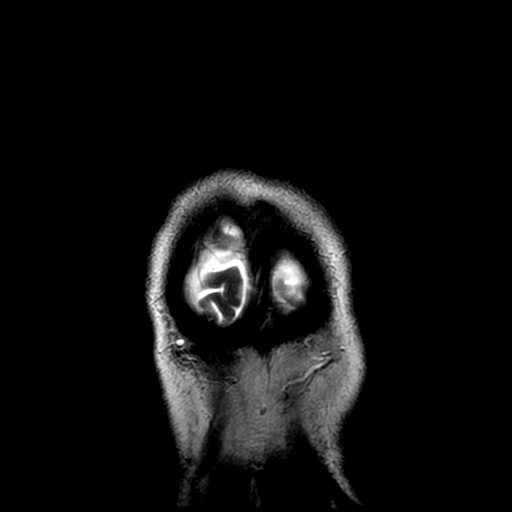
[im 24/24]
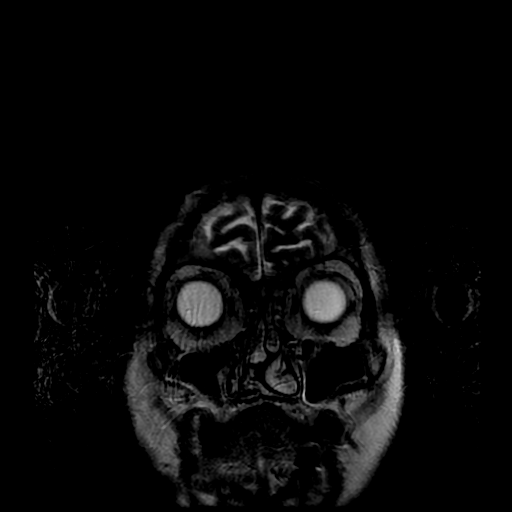

[Series 800: DWI · axial · 3.0mm · 1.09mm/px · z∈[-47,+101]mm · 4 of 50 slices shown (3 of 4)]
[im 1/50]
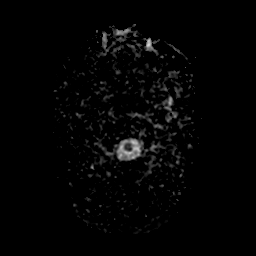
[im 17/50]
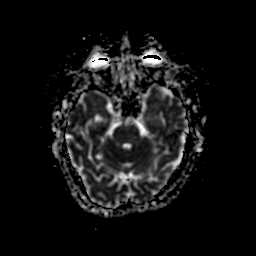
[im 33/50]
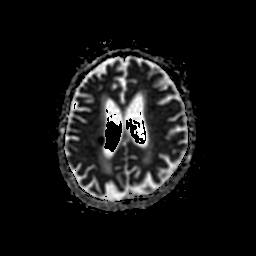
[im 50/50]
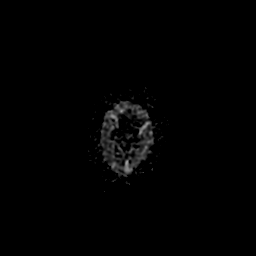

[Series 1200: DWI · coronal · 2.0mm · 1.09mm/px · 6 of 70 slices shown (4 of 4)]
[im 1/70]
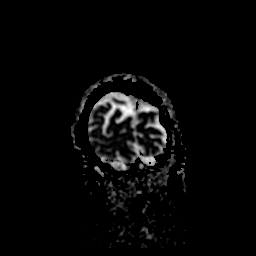
[im 14/70]
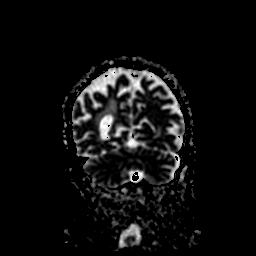
[im 28/70]
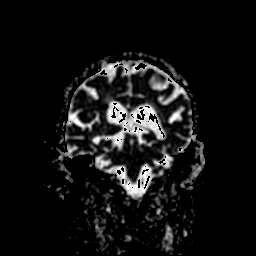
[im 42/70]
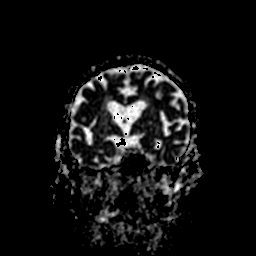
[im 56/70]
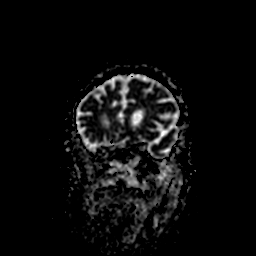
[im 70/70]
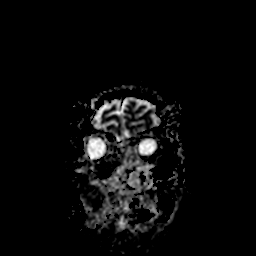

[38 of 48 positions shown; findings below may reference images not displayed]

FINDINGS: Brain: There is an approximately 9 mm area of reduced diffusion
involving the body of the caudate and adjacent corona radiata.
Additional small cortical focus of right frontoparietal parasagittal
reduced diffusion (paracentral lobule).

There is no evidence of hemorrhage. Prominence of the ventricles and
sulci reflects generalized parenchymal volume loss. Confluent areas
of T2 hyperintensity in the supratentorial white matter are
nonspecific but probably reflect moderate chronic microvascular
ischemic changes. There is no intracranial mass or mass effect.
There is no hydrocephalus or extra-axial fluid collection.

Vascular: Major vessel flow voids at the skull base are preserved.

Skull and upper cervical spine: Normal marrow signal is preserved.

Sinuses/Orbits: Mild mucosal thickening. Bilateral lens
replacements.

Other: Sella is unremarkable.  Mastoid air cells are clear.
IMPRESSION: Small acute infarcts involving the right caudate and adjacent white
matter as well as the parasagittal right frontoparietal cortex. No
evidence of hemorrhage.

Moderate chronic microvascular ischemic changes.

## 2020-12-26 DIAGNOSIS — M1990 Unspecified osteoarthritis, unspecified site: Secondary | ICD-10-CM | POA: Diagnosis not present

## 2020-12-26 DIAGNOSIS — N400 Enlarged prostate without lower urinary tract symptoms: Secondary | ICD-10-CM | POA: Diagnosis not present

## 2020-12-26 DIAGNOSIS — M216X2 Other acquired deformities of left foot: Secondary | ICD-10-CM | POA: Diagnosis not present

## 2020-12-26 DIAGNOSIS — K219 Gastro-esophageal reflux disease without esophagitis: Secondary | ICD-10-CM | POA: Diagnosis not present

## 2020-12-26 DIAGNOSIS — T7840XD Allergy, unspecified, subsequent encounter: Secondary | ICD-10-CM | POA: Diagnosis not present

## 2020-12-26 DIAGNOSIS — Z8673 Personal history of transient ischemic attack (TIA), and cerebral infarction without residual deficits: Secondary | ICD-10-CM | POA: Diagnosis not present

## 2021-01-07 DIAGNOSIS — L814 Other melanin hyperpigmentation: Secondary | ICD-10-CM | POA: Diagnosis not present

## 2021-01-07 DIAGNOSIS — C44329 Squamous cell carcinoma of skin of other parts of face: Secondary | ICD-10-CM | POA: Diagnosis not present

## 2021-01-07 DIAGNOSIS — L570 Actinic keratosis: Secondary | ICD-10-CM | POA: Diagnosis not present

## 2021-01-08 DIAGNOSIS — D649 Anemia, unspecified: Secondary | ICD-10-CM | POA: Diagnosis not present

## 2021-01-08 DIAGNOSIS — E559 Vitamin D deficiency, unspecified: Secondary | ICD-10-CM | POA: Diagnosis not present

## 2021-01-17 DIAGNOSIS — S00211A Abrasion of right eyelid and periocular area, initial encounter: Secondary | ICD-10-CM | POA: Diagnosis not present

## 2021-02-05 DIAGNOSIS — H2512 Age-related nuclear cataract, left eye: Secondary | ICD-10-CM | POA: Diagnosis not present

## 2021-02-05 DIAGNOSIS — I129 Hypertensive chronic kidney disease with stage 1 through stage 4 chronic kidney disease, or unspecified chronic kidney disease: Secondary | ICD-10-CM | POA: Diagnosis not present

## 2021-02-05 DIAGNOSIS — H52202 Unspecified astigmatism, left eye: Secondary | ICD-10-CM | POA: Diagnosis not present

## 2021-02-05 DIAGNOSIS — N182 Chronic kidney disease, stage 2 (mild): Secondary | ICD-10-CM | POA: Diagnosis not present

## 2021-02-05 DIAGNOSIS — H259 Unspecified age-related cataract: Secondary | ICD-10-CM | POA: Diagnosis not present

## 2021-02-18 DIAGNOSIS — H2511 Age-related nuclear cataract, right eye: Secondary | ICD-10-CM | POA: Diagnosis not present

## 2021-02-18 DIAGNOSIS — H1820 Unspecified corneal edema: Secondary | ICD-10-CM | POA: Diagnosis not present

## 2021-02-18 DIAGNOSIS — Z961 Presence of intraocular lens: Secondary | ICD-10-CM | POA: Diagnosis not present

## 2021-03-05 DIAGNOSIS — N400 Enlarged prostate without lower urinary tract symptoms: Secondary | ICD-10-CM | POA: Diagnosis not present

## 2021-03-05 DIAGNOSIS — M810 Age-related osteoporosis without current pathological fracture: Secondary | ICD-10-CM | POA: Diagnosis not present

## 2021-03-05 DIAGNOSIS — K219 Gastro-esophageal reflux disease without esophagitis: Secondary | ICD-10-CM | POA: Diagnosis not present

## 2021-03-05 DIAGNOSIS — M1990 Unspecified osteoarthritis, unspecified site: Secondary | ICD-10-CM | POA: Diagnosis not present

## 2021-03-05 DIAGNOSIS — I679 Cerebrovascular disease, unspecified: Secondary | ICD-10-CM | POA: Diagnosis not present

## 2021-03-05 DIAGNOSIS — Z8673 Personal history of transient ischemic attack (TIA), and cerebral infarction without residual deficits: Secondary | ICD-10-CM | POA: Diagnosis not present

## 2021-03-13 DIAGNOSIS — R52 Pain, unspecified: Secondary | ICD-10-CM | POA: Diagnosis not present

## 2021-03-20 DIAGNOSIS — H259 Unspecified age-related cataract: Secondary | ICD-10-CM | POA: Diagnosis not present

## 2021-03-20 DIAGNOSIS — I2510 Atherosclerotic heart disease of native coronary artery without angina pectoris: Secondary | ICD-10-CM | POA: Diagnosis not present

## 2021-03-20 DIAGNOSIS — H52201 Unspecified astigmatism, right eye: Secondary | ICD-10-CM | POA: Diagnosis not present

## 2021-03-20 DIAGNOSIS — H2511 Age-related nuclear cataract, right eye: Secondary | ICD-10-CM | POA: Diagnosis not present

## 2021-03-20 DIAGNOSIS — I10 Essential (primary) hypertension: Secondary | ICD-10-CM | POA: Diagnosis not present

## 2021-04-01 DIAGNOSIS — E785 Hyperlipidemia, unspecified: Secondary | ICD-10-CM | POA: Diagnosis not present

## 2021-04-01 DIAGNOSIS — E559 Vitamin D deficiency, unspecified: Secondary | ICD-10-CM | POA: Diagnosis not present

## 2021-04-01 DIAGNOSIS — D649 Anemia, unspecified: Secondary | ICD-10-CM | POA: Diagnosis not present

## 2021-04-01 DIAGNOSIS — N390 Urinary tract infection, site not specified: Secondary | ICD-10-CM | POA: Diagnosis not present

## 2021-04-01 DIAGNOSIS — I10 Essential (primary) hypertension: Secondary | ICD-10-CM | POA: Diagnosis not present

## 2021-04-01 DIAGNOSIS — E039 Hypothyroidism, unspecified: Secondary | ICD-10-CM | POA: Diagnosis not present

## 2021-04-09 DIAGNOSIS — T7840XD Allergy, unspecified, subsequent encounter: Secondary | ICD-10-CM | POA: Diagnosis not present

## 2021-04-09 DIAGNOSIS — E785 Hyperlipidemia, unspecified: Secondary | ICD-10-CM | POA: Diagnosis not present

## 2021-04-09 DIAGNOSIS — N400 Enlarged prostate without lower urinary tract symptoms: Secondary | ICD-10-CM | POA: Diagnosis not present

## 2021-04-09 DIAGNOSIS — M810 Age-related osteoporosis without current pathological fracture: Secondary | ICD-10-CM | POA: Diagnosis not present

## 2021-04-09 DIAGNOSIS — I639 Cerebral infarction, unspecified: Secondary | ICD-10-CM | POA: Diagnosis not present

## 2021-04-09 DIAGNOSIS — K219 Gastro-esophageal reflux disease without esophagitis: Secondary | ICD-10-CM | POA: Diagnosis not present

## 2021-04-09 DIAGNOSIS — M170 Bilateral primary osteoarthritis of knee: Secondary | ICD-10-CM | POA: Diagnosis not present

## 2021-05-13 DIAGNOSIS — E785 Hyperlipidemia, unspecified: Secondary | ICD-10-CM | POA: Diagnosis not present

## 2021-05-13 DIAGNOSIS — E039 Hypothyroidism, unspecified: Secondary | ICD-10-CM | POA: Diagnosis not present

## 2021-05-13 DIAGNOSIS — D649 Anemia, unspecified: Secondary | ICD-10-CM | POA: Diagnosis not present

## 2021-05-13 DIAGNOSIS — I10 Essential (primary) hypertension: Secondary | ICD-10-CM | POA: Diagnosis not present

## 2021-05-13 DIAGNOSIS — N390 Urinary tract infection, site not specified: Secondary | ICD-10-CM | POA: Diagnosis not present

## 2021-05-13 DIAGNOSIS — E559 Vitamin D deficiency, unspecified: Secondary | ICD-10-CM | POA: Diagnosis not present

## 2021-06-09 DIAGNOSIS — M79674 Pain in right toe(s): Secondary | ICD-10-CM | POA: Diagnosis not present

## 2021-06-09 DIAGNOSIS — B351 Tinea unguium: Secondary | ICD-10-CM | POA: Diagnosis not present

## 2021-06-09 DIAGNOSIS — M79675 Pain in left toe(s): Secondary | ICD-10-CM | POA: Diagnosis not present

## 2021-07-04 DIAGNOSIS — T7840XD Allergy, unspecified, subsequent encounter: Secondary | ICD-10-CM | POA: Diagnosis not present

## 2021-07-04 DIAGNOSIS — N400 Enlarged prostate without lower urinary tract symptoms: Secondary | ICD-10-CM | POA: Diagnosis not present

## 2021-07-04 DIAGNOSIS — K219 Gastro-esophageal reflux disease without esophagitis: Secondary | ICD-10-CM | POA: Diagnosis not present

## 2021-07-04 DIAGNOSIS — M810 Age-related osteoporosis without current pathological fracture: Secondary | ICD-10-CM | POA: Diagnosis not present

## 2021-07-04 DIAGNOSIS — M170 Bilateral primary osteoarthritis of knee: Secondary | ICD-10-CM | POA: Diagnosis not present

## 2021-07-04 DIAGNOSIS — E785 Hyperlipidemia, unspecified: Secondary | ICD-10-CM | POA: Diagnosis not present

## 2021-07-04 DIAGNOSIS — I639 Cerebral infarction, unspecified: Secondary | ICD-10-CM | POA: Diagnosis not present

## 2021-07-08 DIAGNOSIS — L0291 Cutaneous abscess, unspecified: Secondary | ICD-10-CM | POA: Diagnosis not present

## 2021-07-08 DIAGNOSIS — L03211 Cellulitis of face: Secondary | ICD-10-CM | POA: Diagnosis not present

## 2021-07-25 DIAGNOSIS — H26493 Other secondary cataract, bilateral: Secondary | ICD-10-CM | POA: Diagnosis not present

## 2021-07-25 DIAGNOSIS — H3562 Retinal hemorrhage, left eye: Secondary | ICD-10-CM | POA: Diagnosis not present

## 2021-07-25 DIAGNOSIS — Z961 Presence of intraocular lens: Secondary | ICD-10-CM | POA: Diagnosis not present

## 2021-07-25 DIAGNOSIS — H35373 Puckering of macula, bilateral: Secondary | ICD-10-CM | POA: Diagnosis not present

## 2021-08-30 DIAGNOSIS — H6092 Unspecified otitis externa, left ear: Secondary | ICD-10-CM | POA: Diagnosis not present

## 2021-09-03 DIAGNOSIS — T7840XD Allergy, unspecified, subsequent encounter: Secondary | ICD-10-CM | POA: Diagnosis not present

## 2021-09-03 DIAGNOSIS — I69344 Monoplegia of lower limb following cerebral infarction affecting left non-dominant side: Secondary | ICD-10-CM | POA: Diagnosis not present

## 2021-09-03 DIAGNOSIS — M159 Polyosteoarthritis, unspecified: Secondary | ICD-10-CM | POA: Diagnosis not present

## 2021-09-03 DIAGNOSIS — E785 Hyperlipidemia, unspecified: Secondary | ICD-10-CM | POA: Diagnosis not present

## 2021-09-03 DIAGNOSIS — K219 Gastro-esophageal reflux disease without esophagitis: Secondary | ICD-10-CM | POA: Diagnosis not present

## 2021-09-03 DIAGNOSIS — M810 Age-related osteoporosis without current pathological fracture: Secondary | ICD-10-CM | POA: Diagnosis not present

## 2021-09-03 DIAGNOSIS — N400 Enlarged prostate without lower urinary tract symptoms: Secondary | ICD-10-CM | POA: Diagnosis not present

## 2021-09-11 DIAGNOSIS — R4781 Slurred speech: Secondary | ICD-10-CM | POA: Diagnosis not present

## 2021-09-11 DIAGNOSIS — R531 Weakness: Secondary | ICD-10-CM | POA: Diagnosis not present

## 2021-09-12 DIAGNOSIS — I10 Essential (primary) hypertension: Secondary | ICD-10-CM | POA: Diagnosis not present

## 2021-09-12 DIAGNOSIS — D649 Anemia, unspecified: Secondary | ICD-10-CM | POA: Diagnosis not present

## 2021-09-13 DIAGNOSIS — N390 Urinary tract infection, site not specified: Secondary | ICD-10-CM | POA: Diagnosis not present

## 2021-09-13 DIAGNOSIS — M810 Age-related osteoporosis without current pathological fracture: Secondary | ICD-10-CM | POA: Diagnosis not present

## 2021-09-13 DIAGNOSIS — R5381 Other malaise: Secondary | ICD-10-CM | POA: Diagnosis not present

## 2021-09-13 DIAGNOSIS — R339 Retention of urine, unspecified: Secondary | ICD-10-CM | POA: Diagnosis not present

## 2021-09-13 DIAGNOSIS — R000 Tachycardia, unspecified: Secondary | ICD-10-CM | POA: Diagnosis not present

## 2021-09-13 DIAGNOSIS — N3289 Other specified disorders of bladder: Secondary | ICD-10-CM | POA: Diagnosis not present

## 2021-09-13 DIAGNOSIS — N210 Calculus in bladder: Secondary | ICD-10-CM | POA: Diagnosis not present

## 2021-09-13 DIAGNOSIS — N400 Enlarged prostate without lower urinary tract symptoms: Secondary | ICD-10-CM | POA: Diagnosis not present

## 2021-09-13 DIAGNOSIS — R918 Other nonspecific abnormal finding of lung field: Secondary | ICD-10-CM | POA: Diagnosis not present

## 2021-09-13 DIAGNOSIS — A419 Sepsis, unspecified organism: Secondary | ICD-10-CM | POA: Diagnosis not present

## 2021-09-13 DIAGNOSIS — D72829 Elevated white blood cell count, unspecified: Secondary | ICD-10-CM | POA: Diagnosis not present

## 2021-09-13 DIAGNOSIS — R0902 Hypoxemia: Secondary | ICD-10-CM | POA: Diagnosis not present

## 2021-09-13 DIAGNOSIS — Z20822 Contact with and (suspected) exposure to covid-19: Secondary | ICD-10-CM | POA: Diagnosis not present

## 2021-09-13 DIAGNOSIS — J168 Pneumonia due to other specified infectious organisms: Secondary | ICD-10-CM | POA: Diagnosis not present

## 2021-09-13 DIAGNOSIS — K449 Diaphragmatic hernia without obstruction or gangrene: Secondary | ICD-10-CM | POA: Diagnosis not present

## 2021-09-13 DIAGNOSIS — Z8679 Personal history of other diseases of the circulatory system: Secondary | ICD-10-CM | POA: Diagnosis not present

## 2021-09-13 DIAGNOSIS — R1310 Dysphagia, unspecified: Secondary | ICD-10-CM | POA: Diagnosis not present

## 2021-09-13 DIAGNOSIS — Z87438 Personal history of other diseases of male genital organs: Secondary | ICD-10-CM | POA: Diagnosis not present

## 2021-09-13 DIAGNOSIS — K8689 Other specified diseases of pancreas: Secondary | ICD-10-CM | POA: Diagnosis not present

## 2021-09-13 DIAGNOSIS — J189 Pneumonia, unspecified organism: Secondary | ICD-10-CM | POA: Diagnosis not present

## 2021-09-13 DIAGNOSIS — R4182 Altered mental status, unspecified: Secondary | ICD-10-CM | POA: Diagnosis not present

## 2021-09-13 DIAGNOSIS — N401 Enlarged prostate with lower urinary tract symptoms: Secondary | ICD-10-CM | POA: Diagnosis not present

## 2021-09-13 DIAGNOSIS — R1084 Generalized abdominal pain: Secondary | ICD-10-CM | POA: Diagnosis not present

## 2021-09-13 DIAGNOSIS — K219 Gastro-esophageal reflux disease without esophagitis: Secondary | ICD-10-CM | POA: Diagnosis not present

## 2021-09-13 DIAGNOSIS — R509 Fever, unspecified: Secondary | ICD-10-CM | POA: Diagnosis not present

## 2021-09-13 DIAGNOSIS — R338 Other retention of urine: Secondary | ICD-10-CM | POA: Diagnosis not present

## 2021-09-13 DIAGNOSIS — G9341 Metabolic encephalopathy: Secondary | ICD-10-CM | POA: Diagnosis not present

## 2021-09-13 DIAGNOSIS — B9689 Other specified bacterial agents as the cause of diseases classified elsewhere: Secondary | ICD-10-CM | POA: Diagnosis not present

## 2021-09-13 DIAGNOSIS — R030 Elevated blood-pressure reading, without diagnosis of hypertension: Secondary | ICD-10-CM | POA: Diagnosis not present

## 2021-09-13 DIAGNOSIS — Z8673 Personal history of transient ischemic attack (TIA), and cerebral infarction without residual deficits: Secondary | ICD-10-CM | POA: Diagnosis not present

## 2021-09-13 DIAGNOSIS — B9620 Unspecified Escherichia coli [E. coli] as the cause of diseases classified elsewhere: Secondary | ICD-10-CM | POA: Diagnosis not present

## 2021-09-13 DIAGNOSIS — Z743 Need for continuous supervision: Secondary | ICD-10-CM | POA: Diagnosis not present

## 2021-09-14 DIAGNOSIS — J189 Pneumonia, unspecified organism: Secondary | ICD-10-CM | POA: Diagnosis not present

## 2021-09-14 DIAGNOSIS — R000 Tachycardia, unspecified: Secondary | ICD-10-CM | POA: Diagnosis not present

## 2021-09-14 DIAGNOSIS — R339 Retention of urine, unspecified: Secondary | ICD-10-CM | POA: Diagnosis not present

## 2021-09-14 DIAGNOSIS — Z87438 Personal history of other diseases of male genital organs: Secondary | ICD-10-CM | POA: Diagnosis not present

## 2021-09-14 DIAGNOSIS — R030 Elevated blood-pressure reading, without diagnosis of hypertension: Secondary | ICD-10-CM | POA: Diagnosis not present

## 2021-09-14 DIAGNOSIS — A419 Sepsis, unspecified organism: Secondary | ICD-10-CM | POA: Diagnosis not present

## 2021-09-14 DIAGNOSIS — K219 Gastro-esophageal reflux disease without esophagitis: Secondary | ICD-10-CM | POA: Diagnosis not present

## 2021-09-15 DIAGNOSIS — K219 Gastro-esophageal reflux disease without esophagitis: Secondary | ICD-10-CM | POA: Diagnosis not present

## 2021-09-15 DIAGNOSIS — N400 Enlarged prostate without lower urinary tract symptoms: Secondary | ICD-10-CM | POA: Diagnosis not present

## 2021-09-15 DIAGNOSIS — N390 Urinary tract infection, site not specified: Secondary | ICD-10-CM | POA: Diagnosis not present

## 2021-09-15 DIAGNOSIS — R339 Retention of urine, unspecified: Secondary | ICD-10-CM | POA: Diagnosis not present

## 2021-09-15 DIAGNOSIS — R1310 Dysphagia, unspecified: Secondary | ICD-10-CM | POA: Diagnosis not present

## 2021-09-15 DIAGNOSIS — B9620 Unspecified Escherichia coli [E. coli] as the cause of diseases classified elsewhere: Secondary | ICD-10-CM | POA: Diagnosis not present

## 2021-09-15 DIAGNOSIS — A419 Sepsis, unspecified organism: Secondary | ICD-10-CM | POA: Diagnosis not present

## 2021-09-15 DIAGNOSIS — Z8673 Personal history of transient ischemic attack (TIA), and cerebral infarction without residual deficits: Secondary | ICD-10-CM | POA: Diagnosis not present

## 2021-09-16 DIAGNOSIS — Z8679 Personal history of other diseases of the circulatory system: Secondary | ICD-10-CM | POA: Diagnosis not present

## 2021-09-16 DIAGNOSIS — R339 Retention of urine, unspecified: Secondary | ICD-10-CM | POA: Diagnosis not present

## 2021-09-16 DIAGNOSIS — A419 Sepsis, unspecified organism: Secondary | ICD-10-CM | POA: Diagnosis not present

## 2021-09-16 DIAGNOSIS — N390 Urinary tract infection, site not specified: Secondary | ICD-10-CM | POA: Diagnosis not present

## 2021-09-16 DIAGNOSIS — K219 Gastro-esophageal reflux disease without esophagitis: Secondary | ICD-10-CM | POA: Diagnosis not present

## 2021-09-16 DIAGNOSIS — R030 Elevated blood-pressure reading, without diagnosis of hypertension: Secondary | ICD-10-CM | POA: Diagnosis not present

## 2021-09-16 DIAGNOSIS — B9620 Unspecified Escherichia coli [E. coli] as the cause of diseases classified elsewhere: Secondary | ICD-10-CM | POA: Diagnosis not present

## 2021-09-17 DIAGNOSIS — K219 Gastro-esophageal reflux disease without esophagitis: Secondary | ICD-10-CM | POA: Diagnosis not present

## 2021-09-17 DIAGNOSIS — Z7409 Other reduced mobility: Secondary | ICD-10-CM | POA: Diagnosis not present

## 2021-09-17 DIAGNOSIS — A419 Sepsis, unspecified organism: Secondary | ICD-10-CM | POA: Diagnosis not present

## 2021-09-17 DIAGNOSIS — A4151 Sepsis due to Escherichia coli [E. coli]: Secondary | ICD-10-CM | POA: Diagnosis not present

## 2021-09-17 DIAGNOSIS — Z789 Other specified health status: Secondary | ICD-10-CM | POA: Diagnosis not present

## 2021-09-17 DIAGNOSIS — J189 Pneumonia, unspecified organism: Secondary | ICD-10-CM | POA: Diagnosis not present

## 2021-09-17 DIAGNOSIS — R69 Illness, unspecified: Secondary | ICD-10-CM | POA: Diagnosis not present

## 2021-09-17 DIAGNOSIS — D649 Anemia, unspecified: Secondary | ICD-10-CM | POA: Diagnosis not present

## 2021-09-17 DIAGNOSIS — B9689 Other specified bacterial agents as the cause of diseases classified elsewhere: Secondary | ICD-10-CM | POA: Diagnosis not present

## 2021-09-17 DIAGNOSIS — R338 Other retention of urine: Secondary | ICD-10-CM | POA: Diagnosis not present

## 2021-09-17 DIAGNOSIS — M170 Bilateral primary osteoarthritis of knee: Secondary | ICD-10-CM | POA: Diagnosis not present

## 2021-09-17 DIAGNOSIS — Z743 Need for continuous supervision: Secondary | ICD-10-CM | POA: Diagnosis not present

## 2021-09-17 DIAGNOSIS — J168 Pneumonia due to other specified infectious organisms: Secondary | ICD-10-CM | POA: Diagnosis not present

## 2021-09-17 DIAGNOSIS — R3914 Feeling of incomplete bladder emptying: Secondary | ICD-10-CM | POA: Diagnosis not present

## 2021-09-17 DIAGNOSIS — B351 Tinea unguium: Secondary | ICD-10-CM | POA: Diagnosis not present

## 2021-09-17 DIAGNOSIS — Z8679 Personal history of other diseases of the circulatory system: Secondary | ICD-10-CM | POA: Diagnosis not present

## 2021-09-17 DIAGNOSIS — M79675 Pain in left toe(s): Secondary | ICD-10-CM | POA: Diagnosis not present

## 2021-09-17 DIAGNOSIS — N400 Enlarged prostate without lower urinary tract symptoms: Secondary | ICD-10-CM | POA: Diagnosis not present

## 2021-09-17 DIAGNOSIS — Z464 Encounter for fitting and adjustment of orthodontic device: Secondary | ICD-10-CM | POA: Diagnosis not present

## 2021-09-17 DIAGNOSIS — I679 Cerebrovascular disease, unspecified: Secondary | ICD-10-CM | POA: Diagnosis not present

## 2021-09-17 DIAGNOSIS — M79674 Pain in right toe(s): Secondary | ICD-10-CM | POA: Diagnosis not present

## 2021-09-17 DIAGNOSIS — I69844 Monoplegia of lower limb following other cerebrovascular disease affecting left non-dominant side: Secondary | ICD-10-CM | POA: Diagnosis not present

## 2021-09-17 DIAGNOSIS — N401 Enlarged prostate with lower urinary tract symptoms: Secondary | ICD-10-CM | POA: Diagnosis not present

## 2021-09-17 DIAGNOSIS — R918 Other nonspecific abnormal finding of lung field: Secondary | ICD-10-CM | POA: Diagnosis not present

## 2021-09-17 DIAGNOSIS — R7881 Bacteremia: Secondary | ICD-10-CM | POA: Diagnosis not present

## 2021-09-17 DIAGNOSIS — R531 Weakness: Secondary | ICD-10-CM | POA: Diagnosis not present

## 2021-09-17 DIAGNOSIS — N3000 Acute cystitis without hematuria: Secondary | ICD-10-CM | POA: Diagnosis not present

## 2021-09-17 DIAGNOSIS — M5030 Other cervical disc degeneration, unspecified cervical region: Secondary | ICD-10-CM | POA: Diagnosis not present

## 2021-09-17 DIAGNOSIS — Z8673 Personal history of transient ischemic attack (TIA), and cerebral infarction without residual deficits: Secondary | ICD-10-CM | POA: Diagnosis not present

## 2021-09-17 DIAGNOSIS — R410 Disorientation, unspecified: Secondary | ICD-10-CM | POA: Diagnosis not present

## 2021-09-17 DIAGNOSIS — N390 Urinary tract infection, site not specified: Secondary | ICD-10-CM | POA: Diagnosis not present

## 2021-09-17 DIAGNOSIS — I10 Essential (primary) hypertension: Secondary | ICD-10-CM | POA: Diagnosis not present

## 2021-09-17 DIAGNOSIS — R339 Retention of urine, unspecified: Secondary | ICD-10-CM | POA: Diagnosis not present

## 2021-09-17 DIAGNOSIS — Z8619 Personal history of other infectious and parasitic diseases: Secondary | ICD-10-CM | POA: Diagnosis not present

## 2021-09-17 DIAGNOSIS — I517 Cardiomegaly: Secondary | ICD-10-CM | POA: Diagnosis not present

## 2021-09-22 DIAGNOSIS — A4151 Sepsis due to Escherichia coli [E. coli]: Secondary | ICD-10-CM | POA: Diagnosis not present

## 2021-09-22 DIAGNOSIS — N3000 Acute cystitis without hematuria: Secondary | ICD-10-CM | POA: Diagnosis not present

## 2021-09-22 DIAGNOSIS — D649 Anemia, unspecified: Secondary | ICD-10-CM | POA: Diagnosis not present

## 2021-09-22 DIAGNOSIS — I10 Essential (primary) hypertension: Secondary | ICD-10-CM | POA: Diagnosis not present

## 2021-09-22 DIAGNOSIS — Z7409 Other reduced mobility: Secondary | ICD-10-CM | POA: Diagnosis not present

## 2021-09-22 DIAGNOSIS — R338 Other retention of urine: Secondary | ICD-10-CM | POA: Diagnosis not present

## 2021-09-22 DIAGNOSIS — N401 Enlarged prostate with lower urinary tract symptoms: Secondary | ICD-10-CM | POA: Diagnosis not present

## 2021-09-22 DIAGNOSIS — Z8673 Personal history of transient ischemic attack (TIA), and cerebral infarction without residual deficits: Secondary | ICD-10-CM | POA: Diagnosis not present

## 2021-09-22 DIAGNOSIS — Z789 Other specified health status: Secondary | ICD-10-CM | POA: Diagnosis not present

## 2021-09-22 DIAGNOSIS — I679 Cerebrovascular disease, unspecified: Secondary | ICD-10-CM | POA: Diagnosis not present

## 2021-09-23 DIAGNOSIS — Z8619 Personal history of other infectious and parasitic diseases: Secondary | ICD-10-CM | POA: Diagnosis not present

## 2021-09-23 DIAGNOSIS — R410 Disorientation, unspecified: Secondary | ICD-10-CM | POA: Diagnosis not present

## 2021-09-24 DIAGNOSIS — D649 Anemia, unspecified: Secondary | ICD-10-CM | POA: Diagnosis not present

## 2021-09-29 DIAGNOSIS — N401 Enlarged prostate with lower urinary tract symptoms: Secondary | ICD-10-CM | POA: Diagnosis not present

## 2021-09-29 DIAGNOSIS — R3914 Feeling of incomplete bladder emptying: Secondary | ICD-10-CM | POA: Diagnosis not present

## 2021-10-06 DIAGNOSIS — N390 Urinary tract infection, site not specified: Secondary | ICD-10-CM | POA: Diagnosis not present

## 2021-10-06 DIAGNOSIS — M6281 Muscle weakness (generalized): Secondary | ICD-10-CM | POA: Diagnosis not present

## 2021-10-06 DIAGNOSIS — J189 Pneumonia, unspecified organism: Secondary | ICD-10-CM | POA: Diagnosis not present

## 2021-10-06 DIAGNOSIS — R7881 Bacteremia: Secondary | ICD-10-CM | POA: Diagnosis not present

## 2021-10-06 DIAGNOSIS — R2689 Other abnormalities of gait and mobility: Secondary | ICD-10-CM | POA: Diagnosis not present

## 2021-10-06 DIAGNOSIS — R2681 Unsteadiness on feet: Secondary | ICD-10-CM | POA: Diagnosis not present

## 2021-10-07 DIAGNOSIS — R7881 Bacteremia: Secondary | ICD-10-CM | POA: Diagnosis not present

## 2021-10-07 DIAGNOSIS — M6281 Muscle weakness (generalized): Secondary | ICD-10-CM | POA: Diagnosis not present

## 2021-10-07 DIAGNOSIS — J189 Pneumonia, unspecified organism: Secondary | ICD-10-CM | POA: Diagnosis not present

## 2021-10-07 DIAGNOSIS — N390 Urinary tract infection, site not specified: Secondary | ICD-10-CM | POA: Diagnosis not present

## 2021-10-07 DIAGNOSIS — R2681 Unsteadiness on feet: Secondary | ICD-10-CM | POA: Diagnosis not present

## 2021-10-07 DIAGNOSIS — R2689 Other abnormalities of gait and mobility: Secondary | ICD-10-CM | POA: Diagnosis not present

## 2021-10-08 DIAGNOSIS — J189 Pneumonia, unspecified organism: Secondary | ICD-10-CM | POA: Diagnosis not present

## 2021-10-08 DIAGNOSIS — R7881 Bacteremia: Secondary | ICD-10-CM | POA: Diagnosis not present

## 2021-10-08 DIAGNOSIS — R2689 Other abnormalities of gait and mobility: Secondary | ICD-10-CM | POA: Diagnosis not present

## 2021-10-08 DIAGNOSIS — M6281 Muscle weakness (generalized): Secondary | ICD-10-CM | POA: Diagnosis not present

## 2021-10-08 DIAGNOSIS — R2681 Unsteadiness on feet: Secondary | ICD-10-CM | POA: Diagnosis not present

## 2021-10-08 DIAGNOSIS — N390 Urinary tract infection, site not specified: Secondary | ICD-10-CM | POA: Diagnosis not present

## 2021-10-10 DIAGNOSIS — J189 Pneumonia, unspecified organism: Secondary | ICD-10-CM | POA: Diagnosis not present

## 2021-10-10 DIAGNOSIS — M6281 Muscle weakness (generalized): Secondary | ICD-10-CM | POA: Diagnosis not present

## 2021-10-10 DIAGNOSIS — N390 Urinary tract infection, site not specified: Secondary | ICD-10-CM | POA: Diagnosis not present

## 2021-10-10 DIAGNOSIS — R2689 Other abnormalities of gait and mobility: Secondary | ICD-10-CM | POA: Diagnosis not present

## 2021-10-10 DIAGNOSIS — R2681 Unsteadiness on feet: Secondary | ICD-10-CM | POA: Diagnosis not present

## 2021-10-10 DIAGNOSIS — R7881 Bacteremia: Secondary | ICD-10-CM | POA: Diagnosis not present

## 2021-10-13 DIAGNOSIS — R3914 Feeling of incomplete bladder emptying: Secondary | ICD-10-CM | POA: Diagnosis not present

## 2021-10-13 DIAGNOSIS — R2681 Unsteadiness on feet: Secondary | ICD-10-CM | POA: Diagnosis not present

## 2021-10-13 DIAGNOSIS — R2689 Other abnormalities of gait and mobility: Secondary | ICD-10-CM | POA: Diagnosis not present

## 2021-10-13 DIAGNOSIS — J189 Pneumonia, unspecified organism: Secondary | ICD-10-CM | POA: Diagnosis not present

## 2021-10-13 DIAGNOSIS — N401 Enlarged prostate with lower urinary tract symptoms: Secondary | ICD-10-CM | POA: Diagnosis not present

## 2021-10-13 DIAGNOSIS — N390 Urinary tract infection, site not specified: Secondary | ICD-10-CM | POA: Diagnosis not present

## 2021-10-13 DIAGNOSIS — R7881 Bacteremia: Secondary | ICD-10-CM | POA: Diagnosis not present

## 2021-10-13 DIAGNOSIS — M6281 Muscle weakness (generalized): Secondary | ICD-10-CM | POA: Diagnosis not present

## 2021-10-14 DIAGNOSIS — I10 Essential (primary) hypertension: Secondary | ICD-10-CM | POA: Diagnosis not present

## 2021-10-14 DIAGNOSIS — K219 Gastro-esophageal reflux disease without esophagitis: Secondary | ICD-10-CM | POA: Diagnosis not present

## 2021-10-14 DIAGNOSIS — E785 Hyperlipidemia, unspecified: Secondary | ICD-10-CM | POA: Diagnosis not present

## 2021-10-14 DIAGNOSIS — T7840XD Allergy, unspecified, subsequent encounter: Secondary | ICD-10-CM | POA: Diagnosis not present

## 2021-10-14 DIAGNOSIS — M810 Age-related osteoporosis without current pathological fracture: Secondary | ICD-10-CM | POA: Diagnosis not present

## 2021-10-14 DIAGNOSIS — M1990 Unspecified osteoarthritis, unspecified site: Secondary | ICD-10-CM | POA: Diagnosis not present

## 2021-10-14 DIAGNOSIS — Z8673 Personal history of transient ischemic attack (TIA), and cerebral infarction without residual deficits: Secondary | ICD-10-CM | POA: Diagnosis not present

## 2021-10-14 DIAGNOSIS — R9431 Abnormal electrocardiogram [ECG] [EKG]: Secondary | ICD-10-CM | POA: Diagnosis not present

## 2021-10-14 DIAGNOSIS — N400 Enlarged prostate without lower urinary tract symptoms: Secondary | ICD-10-CM | POA: Diagnosis not present

## 2021-10-15 DIAGNOSIS — N390 Urinary tract infection, site not specified: Secondary | ICD-10-CM | POA: Diagnosis not present

## 2021-10-15 DIAGNOSIS — R7881 Bacteremia: Secondary | ICD-10-CM | POA: Diagnosis not present

## 2021-10-15 DIAGNOSIS — R2681 Unsteadiness on feet: Secondary | ICD-10-CM | POA: Diagnosis not present

## 2021-10-15 DIAGNOSIS — R2689 Other abnormalities of gait and mobility: Secondary | ICD-10-CM | POA: Diagnosis not present

## 2021-10-15 DIAGNOSIS — M6281 Muscle weakness (generalized): Secondary | ICD-10-CM | POA: Diagnosis not present

## 2021-10-15 DIAGNOSIS — J189 Pneumonia, unspecified organism: Secondary | ICD-10-CM | POA: Diagnosis not present

## 2021-10-16 DIAGNOSIS — N390 Urinary tract infection, site not specified: Secondary | ICD-10-CM | POA: Diagnosis not present

## 2021-10-16 DIAGNOSIS — I10 Essential (primary) hypertension: Secondary | ICD-10-CM | POA: Diagnosis not present

## 2021-10-16 DIAGNOSIS — R2681 Unsteadiness on feet: Secondary | ICD-10-CM | POA: Diagnosis not present

## 2021-10-16 DIAGNOSIS — J189 Pneumonia, unspecified organism: Secondary | ICD-10-CM | POA: Diagnosis not present

## 2021-10-16 DIAGNOSIS — D649 Anemia, unspecified: Secondary | ICD-10-CM | POA: Diagnosis not present

## 2021-10-16 DIAGNOSIS — R2689 Other abnormalities of gait and mobility: Secondary | ICD-10-CM | POA: Diagnosis not present

## 2021-10-16 DIAGNOSIS — R7881 Bacteremia: Secondary | ICD-10-CM | POA: Diagnosis not present

## 2021-10-16 DIAGNOSIS — M6281 Muscle weakness (generalized): Secondary | ICD-10-CM | POA: Diagnosis not present

## 2021-10-17 DIAGNOSIS — R2689 Other abnormalities of gait and mobility: Secondary | ICD-10-CM | POA: Diagnosis not present

## 2021-10-17 DIAGNOSIS — R7881 Bacteremia: Secondary | ICD-10-CM | POA: Diagnosis not present

## 2021-10-17 DIAGNOSIS — J189 Pneumonia, unspecified organism: Secondary | ICD-10-CM | POA: Diagnosis not present

## 2021-10-17 DIAGNOSIS — N390 Urinary tract infection, site not specified: Secondary | ICD-10-CM | POA: Diagnosis not present

## 2021-10-17 DIAGNOSIS — R2681 Unsteadiness on feet: Secondary | ICD-10-CM | POA: Diagnosis not present

## 2021-10-17 DIAGNOSIS — M6281 Muscle weakness (generalized): Secondary | ICD-10-CM | POA: Diagnosis not present

## 2021-10-20 DIAGNOSIS — R7881 Bacteremia: Secondary | ICD-10-CM | POA: Diagnosis not present

## 2021-10-20 DIAGNOSIS — J189 Pneumonia, unspecified organism: Secondary | ICD-10-CM | POA: Diagnosis not present

## 2021-10-20 DIAGNOSIS — M6281 Muscle weakness (generalized): Secondary | ICD-10-CM | POA: Diagnosis not present

## 2021-10-20 DIAGNOSIS — N390 Urinary tract infection, site not specified: Secondary | ICD-10-CM | POA: Diagnosis not present

## 2021-10-20 DIAGNOSIS — R2689 Other abnormalities of gait and mobility: Secondary | ICD-10-CM | POA: Diagnosis not present

## 2021-10-20 DIAGNOSIS — R2681 Unsteadiness on feet: Secondary | ICD-10-CM | POA: Diagnosis not present

## 2021-10-21 DIAGNOSIS — R2681 Unsteadiness on feet: Secondary | ICD-10-CM | POA: Diagnosis not present

## 2021-10-21 DIAGNOSIS — M6281 Muscle weakness (generalized): Secondary | ICD-10-CM | POA: Diagnosis not present

## 2021-10-21 DIAGNOSIS — R7881 Bacteremia: Secondary | ICD-10-CM | POA: Diagnosis not present

## 2021-10-21 DIAGNOSIS — R2689 Other abnormalities of gait and mobility: Secondary | ICD-10-CM | POA: Diagnosis not present

## 2021-10-21 DIAGNOSIS — N390 Urinary tract infection, site not specified: Secondary | ICD-10-CM | POA: Diagnosis not present

## 2021-10-21 DIAGNOSIS — J189 Pneumonia, unspecified organism: Secondary | ICD-10-CM | POA: Diagnosis not present

## 2021-10-22 DIAGNOSIS — J189 Pneumonia, unspecified organism: Secondary | ICD-10-CM | POA: Diagnosis not present

## 2021-10-22 DIAGNOSIS — M6281 Muscle weakness (generalized): Secondary | ICD-10-CM | POA: Diagnosis not present

## 2021-10-22 DIAGNOSIS — R7881 Bacteremia: Secondary | ICD-10-CM | POA: Diagnosis not present

## 2021-10-22 DIAGNOSIS — N390 Urinary tract infection, site not specified: Secondary | ICD-10-CM | POA: Diagnosis not present

## 2021-10-22 DIAGNOSIS — R2689 Other abnormalities of gait and mobility: Secondary | ICD-10-CM | POA: Diagnosis not present

## 2021-10-22 DIAGNOSIS — R2681 Unsteadiness on feet: Secondary | ICD-10-CM | POA: Diagnosis not present

## 2021-10-23 DIAGNOSIS — N390 Urinary tract infection, site not specified: Secondary | ICD-10-CM | POA: Diagnosis not present

## 2021-10-23 DIAGNOSIS — R2681 Unsteadiness on feet: Secondary | ICD-10-CM | POA: Diagnosis not present

## 2021-10-23 DIAGNOSIS — J189 Pneumonia, unspecified organism: Secondary | ICD-10-CM | POA: Diagnosis not present

## 2021-10-23 DIAGNOSIS — M6281 Muscle weakness (generalized): Secondary | ICD-10-CM | POA: Diagnosis not present

## 2021-10-23 DIAGNOSIS — R2689 Other abnormalities of gait and mobility: Secondary | ICD-10-CM | POA: Diagnosis not present

## 2021-10-23 DIAGNOSIS — R7881 Bacteremia: Secondary | ICD-10-CM | POA: Diagnosis not present

## 2021-10-24 DIAGNOSIS — R2681 Unsteadiness on feet: Secondary | ICD-10-CM | POA: Diagnosis not present

## 2021-10-24 DIAGNOSIS — J189 Pneumonia, unspecified organism: Secondary | ICD-10-CM | POA: Diagnosis not present

## 2021-10-24 DIAGNOSIS — R2689 Other abnormalities of gait and mobility: Secondary | ICD-10-CM | POA: Diagnosis not present

## 2021-10-24 DIAGNOSIS — N390 Urinary tract infection, site not specified: Secondary | ICD-10-CM | POA: Diagnosis not present

## 2021-10-24 DIAGNOSIS — R7881 Bacteremia: Secondary | ICD-10-CM | POA: Diagnosis not present

## 2021-10-24 DIAGNOSIS — M6281 Muscle weakness (generalized): Secondary | ICD-10-CM | POA: Diagnosis not present

## 2021-10-27 DIAGNOSIS — N390 Urinary tract infection, site not specified: Secondary | ICD-10-CM | POA: Diagnosis not present

## 2021-10-27 DIAGNOSIS — R7881 Bacteremia: Secondary | ICD-10-CM | POA: Diagnosis not present

## 2021-10-27 DIAGNOSIS — R2689 Other abnormalities of gait and mobility: Secondary | ICD-10-CM | POA: Diagnosis not present

## 2021-10-27 DIAGNOSIS — M6281 Muscle weakness (generalized): Secondary | ICD-10-CM | POA: Diagnosis not present

## 2021-10-27 DIAGNOSIS — R2681 Unsteadiness on feet: Secondary | ICD-10-CM | POA: Diagnosis not present

## 2021-10-27 DIAGNOSIS — J189 Pneumonia, unspecified organism: Secondary | ICD-10-CM | POA: Diagnosis not present

## 2021-10-28 DIAGNOSIS — R7881 Bacteremia: Secondary | ICD-10-CM | POA: Diagnosis not present

## 2021-10-28 DIAGNOSIS — M6281 Muscle weakness (generalized): Secondary | ICD-10-CM | POA: Diagnosis not present

## 2021-10-28 DIAGNOSIS — F341 Dysthymic disorder: Secondary | ICD-10-CM | POA: Diagnosis not present

## 2021-10-28 DIAGNOSIS — J189 Pneumonia, unspecified organism: Secondary | ICD-10-CM | POA: Diagnosis not present

## 2021-10-28 DIAGNOSIS — R69 Illness, unspecified: Secondary | ICD-10-CM | POA: Diagnosis not present

## 2021-10-28 DIAGNOSIS — R2689 Other abnormalities of gait and mobility: Secondary | ICD-10-CM | POA: Diagnosis not present

## 2021-10-28 DIAGNOSIS — R2681 Unsteadiness on feet: Secondary | ICD-10-CM | POA: Diagnosis not present

## 2021-10-28 DIAGNOSIS — N390 Urinary tract infection, site not specified: Secondary | ICD-10-CM | POA: Diagnosis not present

## 2021-10-29 DIAGNOSIS — R7881 Bacteremia: Secondary | ICD-10-CM | POA: Diagnosis not present

## 2021-10-29 DIAGNOSIS — J189 Pneumonia, unspecified organism: Secondary | ICD-10-CM | POA: Diagnosis not present

## 2021-10-29 DIAGNOSIS — R2689 Other abnormalities of gait and mobility: Secondary | ICD-10-CM | POA: Diagnosis not present

## 2021-10-29 DIAGNOSIS — N390 Urinary tract infection, site not specified: Secondary | ICD-10-CM | POA: Diagnosis not present

## 2021-10-29 DIAGNOSIS — M6281 Muscle weakness (generalized): Secondary | ICD-10-CM | POA: Diagnosis not present

## 2021-10-29 DIAGNOSIS — R2681 Unsteadiness on feet: Secondary | ICD-10-CM | POA: Diagnosis not present

## 2021-10-30 DIAGNOSIS — K5900 Constipation, unspecified: Secondary | ICD-10-CM | POA: Diagnosis not present

## 2021-10-30 DIAGNOSIS — R7881 Bacteremia: Secondary | ICD-10-CM | POA: Diagnosis not present

## 2021-10-30 DIAGNOSIS — J189 Pneumonia, unspecified organism: Secondary | ICD-10-CM | POA: Diagnosis not present

## 2021-10-30 DIAGNOSIS — N3289 Other specified disorders of bladder: Secondary | ICD-10-CM | POA: Diagnosis not present

## 2021-10-30 DIAGNOSIS — Z5189 Encounter for other specified aftercare: Secondary | ICD-10-CM | POA: Diagnosis not present

## 2021-10-30 DIAGNOSIS — R3914 Feeling of incomplete bladder emptying: Secondary | ICD-10-CM | POA: Diagnosis not present

## 2021-10-30 DIAGNOSIS — N390 Urinary tract infection, site not specified: Secondary | ICD-10-CM | POA: Diagnosis not present

## 2021-10-30 DIAGNOSIS — N401 Enlarged prostate with lower urinary tract symptoms: Secondary | ICD-10-CM | POA: Diagnosis not present

## 2021-10-30 DIAGNOSIS — R2689 Other abnormalities of gait and mobility: Secondary | ICD-10-CM | POA: Diagnosis not present

## 2021-10-30 DIAGNOSIS — R338 Other retention of urine: Secondary | ICD-10-CM | POA: Diagnosis not present

## 2021-10-30 DIAGNOSIS — R2681 Unsteadiness on feet: Secondary | ICD-10-CM | POA: Diagnosis not present

## 2021-10-30 DIAGNOSIS — M6281 Muscle weakness (generalized): Secondary | ICD-10-CM | POA: Diagnosis not present

## 2021-10-31 DIAGNOSIS — M6281 Muscle weakness (generalized): Secondary | ICD-10-CM | POA: Diagnosis not present

## 2021-10-31 DIAGNOSIS — R7881 Bacteremia: Secondary | ICD-10-CM | POA: Diagnosis not present

## 2021-10-31 DIAGNOSIS — R2689 Other abnormalities of gait and mobility: Secondary | ICD-10-CM | POA: Diagnosis not present

## 2021-10-31 DIAGNOSIS — N390 Urinary tract infection, site not specified: Secondary | ICD-10-CM | POA: Diagnosis not present

## 2021-10-31 DIAGNOSIS — R2681 Unsteadiness on feet: Secondary | ICD-10-CM | POA: Diagnosis not present

## 2021-10-31 DIAGNOSIS — J189 Pneumonia, unspecified organism: Secondary | ICD-10-CM | POA: Diagnosis not present

## 2021-11-03 DIAGNOSIS — R2689 Other abnormalities of gait and mobility: Secondary | ICD-10-CM | POA: Diagnosis not present

## 2021-11-03 DIAGNOSIS — R2681 Unsteadiness on feet: Secondary | ICD-10-CM | POA: Diagnosis not present

## 2021-11-03 DIAGNOSIS — J189 Pneumonia, unspecified organism: Secondary | ICD-10-CM | POA: Diagnosis not present

## 2021-11-03 DIAGNOSIS — R7881 Bacteremia: Secondary | ICD-10-CM | POA: Diagnosis not present

## 2021-11-03 DIAGNOSIS — N390 Urinary tract infection, site not specified: Secondary | ICD-10-CM | POA: Diagnosis not present

## 2021-11-03 DIAGNOSIS — M6281 Muscle weakness (generalized): Secondary | ICD-10-CM | POA: Diagnosis not present

## 2021-11-04 DIAGNOSIS — R7881 Bacteremia: Secondary | ICD-10-CM | POA: Diagnosis not present

## 2021-11-04 DIAGNOSIS — N390 Urinary tract infection, site not specified: Secondary | ICD-10-CM | POA: Diagnosis not present

## 2021-11-04 DIAGNOSIS — R2689 Other abnormalities of gait and mobility: Secondary | ICD-10-CM | POA: Diagnosis not present

## 2021-11-04 DIAGNOSIS — M6281 Muscle weakness (generalized): Secondary | ICD-10-CM | POA: Diagnosis not present

## 2021-11-04 DIAGNOSIS — J189 Pneumonia, unspecified organism: Secondary | ICD-10-CM | POA: Diagnosis not present

## 2021-11-04 DIAGNOSIS — R2681 Unsteadiness on feet: Secondary | ICD-10-CM | POA: Diagnosis not present

## 2021-11-05 DIAGNOSIS — R2681 Unsteadiness on feet: Secondary | ICD-10-CM | POA: Diagnosis not present

## 2021-11-05 DIAGNOSIS — R7881 Bacteremia: Secondary | ICD-10-CM | POA: Diagnosis not present

## 2021-11-05 DIAGNOSIS — N390 Urinary tract infection, site not specified: Secondary | ICD-10-CM | POA: Diagnosis not present

## 2021-11-05 DIAGNOSIS — M6281 Muscle weakness (generalized): Secondary | ICD-10-CM | POA: Diagnosis not present

## 2021-11-05 DIAGNOSIS — J189 Pneumonia, unspecified organism: Secondary | ICD-10-CM | POA: Diagnosis not present

## 2021-11-05 DIAGNOSIS — R2689 Other abnormalities of gait and mobility: Secondary | ICD-10-CM | POA: Diagnosis not present

## 2021-11-06 DIAGNOSIS — R2681 Unsteadiness on feet: Secondary | ICD-10-CM | POA: Diagnosis not present

## 2021-11-06 DIAGNOSIS — R2689 Other abnormalities of gait and mobility: Secondary | ICD-10-CM | POA: Diagnosis not present

## 2021-11-06 DIAGNOSIS — J189 Pneumonia, unspecified organism: Secondary | ICD-10-CM | POA: Diagnosis not present

## 2021-11-06 DIAGNOSIS — N390 Urinary tract infection, site not specified: Secondary | ICD-10-CM | POA: Diagnosis not present

## 2021-11-06 DIAGNOSIS — R7881 Bacteremia: Secondary | ICD-10-CM | POA: Diagnosis not present

## 2021-11-06 DIAGNOSIS — M6281 Muscle weakness (generalized): Secondary | ICD-10-CM | POA: Diagnosis not present

## 2021-11-07 DIAGNOSIS — D649 Anemia, unspecified: Secondary | ICD-10-CM | POA: Diagnosis not present

## 2021-11-07 DIAGNOSIS — E785 Hyperlipidemia, unspecified: Secondary | ICD-10-CM | POA: Diagnosis not present

## 2021-11-07 DIAGNOSIS — I10 Essential (primary) hypertension: Secondary | ICD-10-CM | POA: Diagnosis not present

## 2021-11-07 DIAGNOSIS — E039 Hypothyroidism, unspecified: Secondary | ICD-10-CM | POA: Diagnosis not present

## 2021-11-07 DIAGNOSIS — N390 Urinary tract infection, site not specified: Secondary | ICD-10-CM | POA: Diagnosis not present

## 2021-11-07 DIAGNOSIS — E559 Vitamin D deficiency, unspecified: Secondary | ICD-10-CM | POA: Diagnosis not present

## 2023-01-08 DEATH — deceased

## 2024-11-19 NOTE — Discharge Summary (Signed)
 [facility] Hospitalist Discharge Summary  Identifying Information:  Steven [name] Aug 14, 1931 [id number]  Admit date: 11/12/2024  Discharge date: 11/19/2024  Discharge Service: [facility] Hospitalist  Discharge Attending Physician:Bhaskar Pusuluri, MD  Discharge to: Skilled nursing facility  Discharge Diagnoses: Acute metabolic encephalopathy Acute on chronic hypoxic and hypercarbic respiratory failure Hepatic encephalopathy Chronic indwelling Foley catheter Recurrent UTIs Interstitial pneumonitis Pulmonary fibrosis Hyponatremia Stroke with residual weakness Ambulatory dysfunction Chronic diastolic CHF DNR/limited [facility] Course:   89 years old male with PMH of stroke with residual weakness, ambulatory dysfunction, diastolic CHF, BPH, chronic indwelling Foley catheter, recurrent UTIs, interstitial pneumonitis, pulmonary fibrosis, chronic hypoxic respiratory failure, hyponatremia, admitted from SNF on 11/12/24 with confusion.   He is admitted with acute metabolic encephalopathy secondary to hypercapnia and hyperammonemia.   He has chronic indwelling Foley catheter.  He initially suspected to have toxic encephalopathy secondary to CAUTI.His Foley catheter was exchanged on 1/4 in the ER.  CT of the head did not have any acute intracranial abnormality.  UA revealed> 20 RBC,> 50 WBC, 500 leukocyte esterase and positive for nitrate.  Urine cultures grew mixed bacterial flora.  Urine cultures on 09/13/2023 revealed Proteus Mirabella's.  TSH, vitamin B12 are normal.  he received ceftriaxone IV from 1/4-1/6.  He is on chronic antibiotic prophylaxis with Macrobid.    It appears like his acute encephalopathy is secondary to acute on chronic hypoxic and hypercarbic respiratory failure and hepatic encephalopathy.  At baseline he is on 3 L supplemental oxygen.  He does not have history of OSA.  He had a rapid response on 1/6 night due to worsening dyspnea, obtundation.  Ammonia elevated at 83.   VBG revealed 7.19/84.  His pCO2 improved to 61 with BiPAP.  His mental status improved after he was started on BiPAP. CXR revealed improvement in pulmonary edema.  His mental status worsened again on 1/9 morning.  It improved spontaneously by 1/10 without any different interventions.  Now he is alert oriented to person and place.  He used BiPAP during the night during this hospitalization.  He does not have any diagnosis of sleep apnea.  His LFTs are normal.  But he noted to have elevated ammonia during this hospitalization.  His home laxatives are discontinued and he is started on lactulose.   Speech therapy saw him, cleared him for dysphagia diet with thin liquids.  He is transferred to his SNF on 1/11.  Predictive Model Details        19% (Medium)  Factor Value   Calculated 11/19/2024 08:06 9% Number of active outpatient medication orders 21   Readmission Risk Score v2 Model 8% Number of appointments in last 90 days 0    7% Latest RDW in last 72 hrs 14.5 %    7% Braden score 11    7% Latest hemoglobin in last 72 hrs 12 g/dL      Post Discharge Follow Up Issues:  Follow-up with PCP in 1 week   Procedures: none _________________________________________________________________ Discharge Day Services: BP 107/64 (BP Location: Left arm, Patient Position: Lying)   Pulse 98   Temp 97.7 F (36.5 C) (Axillary)   Resp 17   Ht 1.727 m (5' 8)   Wt 68 kg (150 lb)   SpO2 97%   BMI 22.81 kg/m  Pt seen on the day of discharge and determined appropriate for discharge.  Physical Exam GEN: NAD, lying in bed, breathing on oxygen via nasal cannula. EYES: EOMI ENT: MMM CV: RRR PULM: CTA B ABD:  soft, NT/ND, +BS EXT: No edema GU:-Foley catheter in place Neuro:-Alert, oriented to person and place.  Condition at Discharge: poor  Length of Discharge: I spent 45  mins in the discharge of this patient. _____________________________________________________________________________ Discharge  Medications: Patient Instructions:    Discharge Medications     New Medications      Sig Disp Refill Start End  lactulose 10 gram/15 mL solution Commonly known as: CHRONULAC  Take 30 mL (20 g total) by mouth 3 (three) times a day for 10 days.   0         Modified Medications      Sig Disp Refill Start End  cranberry extract 500 mg Tab What changed: when to take this  Take 1 tablet (500 mg total) by mouth daily.  90 tablet  3         Medications To Continue      Sig Disp Refill Start End  acetaminophen  500 mg tablet Commonly known as: TYLENOL   Take 1,000 mg by mouth 2 (two) times a day.   0     aspirin  81 mg EC tablet  Take 81 mg by mouth daily.   0     clopidogreL  75 mg tablet Commonly known as: PLAVIX   Take 75 mg by mouth daily.   0     docusate sodium 100 mg capsule Commonly known as: COLACE  Take 200 mg by mouth daily.   0     famotidine  20 mg tablet Commonly known as: PEPCID   Take 20 mg by mouth 2 (two) times a day.   0     furosemide 20 mg tablet Commonly known as: LASIX  Take 10 mg by mouth 3 (three) times a week.   0     loratadine  10 mg tablet Commonly known as: CLARITIN   Take 10 mg by mouth daily.   0     metoprolol succinate 25 mg 24 hr tablet Commonly known as: TOPROL XL  Take 12.5 mg by mouth daily.   0     nitrofurantoin (macrocrystal-monohydrate) 100 mg capsule Commonly known as: MACROBID  Take 100 mg by mouth at bedtime.   0     oxyBUTYnin 5 mg tablet Commonly known as: DITROPAN  Take 5 mg by mouth 2 (two) times a day.   0     * oxygen gas Commonly known as: O2  Inhale 2 L/min 3 (three) times a day. via nasal canula   0     * oxygen gas Commonly known as: O2  Inhale 2 L/min at bedtime.   0     pantoprazole  40 mg EC tablet Commonly known as: PROTONIX   Take 40 mg by mouth 2 (two) times a day.   0     saccharomyces boulardii 250 mg capsule Commonly known as: FLORASTOR  Take 250 mg by mouth 2 (two) times a  day.   0        * * There are duplicate medications prescribed to the patient          Stopped Medications    donepeziL 10 mg tablet Commonly known as: ARICEPT   melatonin 10 mg Cap   metoprolol tartrate 25 mg tablet Commonly known as: LOPRESSOR   polyethylene glycol 17 gram Powd powder Commonly known as: MIRALAX   solifenacin 5 mg tablet Commonly known as: VESICARE       Unreviewed Medications      Sig Disp Refill Start End  tiotropium-olodateroL 2.5-2.5 mcg/actuation inhaler  Commonly known as: STIOLTO RESPIMAT  Inhale 2 puffs daily.  1 each  0          _____________________________________________________________________________ Pending Test Results (if blank, then none):    Most Recent Labs:     Lab Results  Component Value Date   WBC 6.67 11/17/2024   HGB 12.0 (L) 11/17/2024   HCT 35.9 (L) 11/17/2024   PLT 258 11/17/2024    Lab Results  Component Value Date   CO2 33 (H) 11/19/2024   BUN 23 11/19/2024   GLUCOSE 101 (H) 11/19/2024   CREATININE 0.43 (L) 11/19/2024   CALCIUM  8.8 11/19/2024   ALBUMIN 3.9 11/12/2024   AST 15 11/12/2024   ALT 12 11/12/2024    Lab Results  Component Value Date   NA 141 11/19/2024   K 3.6 11/19/2024   CL 102 11/19/2024   CO2 33 (H) 11/19/2024   BUN 23 11/19/2024   CREATININE 0.43 (L) 11/19/2024   CALCIUM  8.8 11/19/2024   MG 2.1 11/15/2024    Lab Results  Component Value Date   BILITOT 0.6 11/12/2024   PROT 8.1 11/12/2024   ALBUMIN 3.9 11/12/2024   ALT 12 11/12/2024   AST 15 11/12/2024    No results found for: PT, INR, APTT Hospital Radiology:  XR Chest 1 View Result Date: 11/14/2024 Date of Service: 2024-11-14 18:19:00 EXAM: XR Chest, 1  View. CLINICAL HISTORY: Irregular breathing, resp acidosis. COMPARISON:  CR - XR CHEST 1 VIEW - 11/12/24 16:32 EST  FINDINGS: LUNGS AND PLEURA: Prominent lung markings bilaterally. COPD seen. Small bilateral pleural effusions are seen. No pneumothorax is  seen. HEART AND MEDIASTINUM: The heart size and mediastinal contours are normal. BONES: No acute osseous abnormality.   Decrease in pulmonary edema Electronically signed by: Lawton Fila MD on 11/14/2024 06:38:20 PM [facility]  ECG 12 lead Result Date: 11/13/2024 Ventricular Rate                   87        BPM                 Atrial Rate                        87        BPM                 P-R Interval                       164       ms                  QRS Duration                       94        ms                  Q-T Interval                       370       ms                  QTC Calculation Bazett             445       ms  Calculated P Axis                  57        degrees             Calculated R Axis                  -28       degrees             Calculated T Axis                  45        degrees             Sinus rhythm with Premature atrial complexes Otherwise normal ECG When compared with ECG of 01-Apr-2024 17 30, Premature atrial complexes are now present Confirmed by Rojelio Dunnings  [phone]  on 11-13-2024 6 27 03 AM  CT Head WO Contrast W Quant CT Tiss Character When Performed Result Date: 11/12/2024 Date of Service: 2024-11-12 16:07:00 EXAM: CT Head Without Intravenous Contrast. CLINICAL HISTORY: Ams. TECHNIQUE: Axial computed tomography images of the head/brain without intravenous contrast. COMPARISON: CT - HEAD,HEAD_WO (ADULT) - 04/01/2024 02:58 PM EDT FINDINGS: BRAIN: No acute intraparenchymal hemorrhage. No mass lesion. No CT evidence for acute territorial infarct. No midline shift or extra-axial collection. Mild atrophy and periventricular deep white matter small vessel ischemia. VENTRICLES: No hydrocephalus. ORBITS: The orbits are unremarkable. SINUSES AND MASTOIDS: The paranasal sinuses and mastoid air cells are clear. SOFT TISSUES: No significant facial or scalp soft tissue swelling evident. BONES: No acute skull fracture.   No acute intracranial abnormality. Mild atrophy  and periventricular deep white matter small vessel ischemia. Electronically signed by: Roxie Lesches, DO on 11/12/2024 05:48:38 PM [facility] Per PQRS, all CT exams are performed using one or more of the following dose reduction techniques: automated exposure control, adjustment of the mA and/or kV according to patient size, or use of iterative reconstruction technique.   XR Chest 1 View Result Date: 11/12/2024 Date of Service: 2024-11-12 15:29:00 EXAM: XR Chest, 1 View. CLINICAL HISTORY: Fever. COMPARISON: CR - XR CHEST 1 VIEW - 04/01/2024 01:18 PM EDT FINDINGS: LUNGS AND PLEURA: Moderate scattered infiltrates identified throughout both lungs. Underlying fibrosis and some scarring identified in both lungs. Mild right effusion. No pneumothorax. HEART AND MEDIASTINUM: The heart size and mediastinal contours are normal. BONES: No acute osseous abnormality.   Moderate scattered infiltrates identified throughout both lungs. Underlying fibrosis and some scarring identified in both lungs. Mild right effusion. No pneumothorax. Electronically signed by: Roxie Lesches, DO on 11/12/2024 05:46:48 PM [facility]   _____________________________________________________________________________ Discharge Instructions:   Discharge Orders     DNAR per Portable DNAR     Discharge Diet (specify)     Details:    Diet type: Dysphagia (difficulty swallowing)   Dysphagia Fluid Modification: Thin liquids (nothing added)   Dysphagia Food Modification: Soft and bite sized foods (Level 6)   Lifting Limits:     Details:    Lifting Limits: No lifting limits        Future Appointments  Date Time Provider Department Center  08/09/2025 10:40 AM Deatrice Tisa Campanile, MD [facility]         *Some images could not be shown.
